# Patient Record
Sex: Female | Born: 2000 | Race: Black or African American | Hispanic: No | Marital: Single | Smoking: Never smoker
Health system: Southern US, Community
[De-identification: ages and names within clinical notes are randomized; demographics above are authoritative.]

## PROBLEM LIST (undated history)

## (undated) DIAGNOSIS — Z789 Other specified health status: Secondary | ICD-10-CM

## (undated) HISTORY — DX: Other specified health status: Z78.9

---

## 2021-09-12 ENCOUNTER — Ambulatory Visit (INDEPENDENT_AMBULATORY_CARE_PROVIDER_SITE_OTHER): Payer: Medicaid Other

## 2021-09-12 ENCOUNTER — Encounter: Payer: Self-pay | Admitting: General Practice

## 2021-09-12 ENCOUNTER — Ambulatory Visit (INDEPENDENT_AMBULATORY_CARE_PROVIDER_SITE_OTHER): Payer: Medicaid Other | Admitting: Obstetrics & Gynecology

## 2021-09-12 ENCOUNTER — Other Ambulatory Visit: Payer: Self-pay

## 2021-09-12 ENCOUNTER — Encounter: Payer: Self-pay | Admitting: Obstetrics & Gynecology

## 2021-09-12 ENCOUNTER — Other Ambulatory Visit (HOSPITAL_COMMUNITY)
Admission: RE | Admit: 2021-09-12 | Discharge: 2021-09-12 | Disposition: A | Discharge status: Home / Self Care | Payer: Medicaid Other | Source: Ambulatory Visit | Attending: Obstetrics & Gynecology | Admitting: Obstetrics & Gynecology

## 2021-09-12 VITALS — BP 126/71 | HR 124 | Ht 63.0 in | Wt 119.0 lb

## 2021-09-12 DIAGNOSIS — Z3A12 12 weeks gestation of pregnancy: Secondary | ICD-10-CM

## 2021-09-12 DIAGNOSIS — Z3A10 10 weeks gestation of pregnancy: Secondary | ICD-10-CM

## 2021-09-12 DIAGNOSIS — Z3401 Encounter for supervision of normal first pregnancy, first trimester: Secondary | ICD-10-CM

## 2021-09-12 NOTE — Patient Instructions (Signed)
First Trimester of Pregnancy °The first trimester of pregnancy starts on the first day of your last menstrual period until the end of week 12. This is months 1 through 3 of pregnancy. A week after a sperm fertilizes an egg, the egg will implant into the wall of the uterus and begin to develop into a baby. By the end of 12 weeks, all the baby's organs will be formed and the baby will be 2-3 inches in size. °Body changes during your first trimester °Your body goes through many changes during pregnancy. The changes vary and generally return to normal after your baby is born. °Physical changes °You may gain or lose weight. °Your breasts may begin to grow larger and become tender. The tissue that surrounds your nipples (areola) may become darker. °Dark spots or blotches (chloasma or mask of pregnancy) may develop on your face. °You may have changes in your hair. These can include thickening or thinning of your hair or changes in texture. °Health changes °You may feel nauseous, and you may vomit. °You may have heartburn. °You may develop headaches. °You may develop constipation. °Your gums may bleed and may be sensitive to brushing and flossing. °Other changes °You may tire easily. °You may urinate more often. °Your menstrual periods will stop. °You may have a loss of appetite. °You may develop cravings for certain kinds of food. °You may have changes in your emotions from day to day. °You may have more vivid and strange dreams. °Follow these instructions at home: °Medicines °Follow your health care provider's instructions regarding medicine use. Specific medicines may be either safe or unsafe to take during pregnancy. Do not take any medicines unless told to by your health care provider. °Take a prenatal vitamin that contains at least 600 micrograms (mcg) of folic acid. °Eating and drinking °Eat a healthy diet that includes fresh fruits and vegetables, whole grains, good sources of protein such as meat, eggs, or tofu,  and low-fat dairy products. °Avoid raw meat and unpasteurized juice, milk, and cheese. These carry germs that can harm you and your baby. °If you feel nauseous or you vomit: °Eat 4 or 5 small meals a day instead of 3 large meals. °Try eating a few soda crackers. °Drink liquids between meals instead of during meals. °You may need to take these actions to prevent or treat constipation: °Drink enough fluid to keep your urine pale yellow. °Eat foods that are high in fiber, such as beans, whole grains, and fresh fruits and vegetables. °Limit foods that are high in fat and processed sugars, such as fried or sweet foods. °Activity °Exercise only as directed by your health care provider. Most people can continue their usual exercise routine during pregnancy. Try to exercise for 30 minutes at least 5 days a week. °Stop exercising if you develop pain or cramping in the lower abdomen or lower back. °Avoid exercising if it is very hot or humid or if you are at high altitude. °Avoid heavy lifting. °If you choose to, you may have sex unless your health care provider tells you not to. °Relieving pain and discomfort °Wear a good support bra to relieve breast tenderness. °Rest with your legs elevated if you have leg cramps or low back pain. °If you develop bulging veins (varicose veins) in your legs: °Wear support hose as told by your health care provider. °Elevate your feet for 15 minutes, 3-4 times a day. °Limit salt in your diet. °Safety °Wear your seat belt at all times when driving   or riding in a car. °Talk with your health care provider if someone is verbally or physically abusive to you. °Talk with your health care provider if you are feeling sad or have thoughts of hurting yourself. °Lifestyle °Do not use hot tubs, steam rooms, or saunas. °Do not douche. Do not use tampons or scented sanitary pads. °Do not use herbal remedies, alcohol, illegal drugs, or medicines that are not approved by your health care provider. Chemicals  in these products can harm your baby. °Do not use any products that contain nicotine or tobacco, such as cigarettes, e-cigarettes, and chewing tobacco. If you need help quitting, ask your health care provider. °Avoid cat litter boxes and soil used by cats. These carry germs that can cause birth defects in the baby and possibly loss of the unborn baby (fetus) by miscarriage or stillbirth. °General instructions °During routine prenatal visits in the first trimester, your health care provider will do a physical exam, perform necessary tests, and ask you how things are going. Keep all follow-up visits. This is important. °Ask for help if you have counseling or nutritional needs during pregnancy. Your health care provider can offer advice or refer you to specialists for help with various needs. °Schedule a dentist appointment. At home, brush your teeth with a soft toothbrush. Floss gently. °Write down your questions. Take them to your prenatal visits. °Where to find more information °American Pregnancy Association: americanpregnancy.org °American College of Obstetricians and Gynecologists: acog.org/en/Womens%20Health/Pregnancy °Office on Women's Health: womenshealth.gov/pregnancy °Contact a health care provider if you have: °Dizziness. °A fever. °Mild pelvic cramps, pelvic pressure, or nagging pain in the abdominal area. °Nausea, vomiting, or diarrhea that lasts for 24 hours or longer. °A bad-smelling vaginal discharge. °Pain when you urinate. °Known exposure to a contagious illness, such as chickenpox, measles, Zika virus, HIV, or hepatitis. °Get help right away if you have: °Spotting or bleeding from your vagina. °Severe abdominal cramping or pain. °Shortness of breath or chest pain. °Any kind of trauma, such as from a fall or a car crash. °New or increased pain, swelling, or redness in an arm or leg. °Summary °The first trimester of pregnancy starts on the first day of your last menstrual period until the end of week  12 (months 1 through 3). °Eating 4 or 5 small meals a day rather than 3 large meals may help to relieve nausea and vomiting. °Do not use any products that contain nicotine or tobacco, such as cigarettes, e-cigarettes, and chewing tobacco. If you need help quitting, ask your health care provider. °Keep all follow-up visits. This is important. °This information is not intended to replace advice given to you by your health care provider. Make sure you discuss any questions you have with your health care provider. °Document Revised: 04/12/2020 Document Reviewed: 02/17/2020 °Elsevier Patient Education © 2022 Elsevier Inc. ° °

## 2021-09-12 NOTE — Progress Notes (Signed)
History:   Tonya Wilkinson is a 20 y.o. G1P0 at [redacted]w[redacted]d by early ultrasound  not consistent with LMP being seen today for her first obstetrical visit.  Patient reports no complaints.      HISTORY: OB History  Gravida Para Term Preterm AB Living  1 0 0 0 0 0  SAB IAB Ectopic Multiple Live Births  0 0 0 0 0    # Outcome Date GA Lbr Len/2nd Weight Sex Delivery Anes PTL Lv  1 Current            Past Medical History:  Diagnosis Date   Medical history non-contributory    History reviewed. No pertinent surgical history. Family History  Problem Relation Age of Onset   Hypertension Sister    Cancer Neg Hx    Diabetes Neg Hx    Social History   Tobacco Use   Smoking status: Never   Smokeless tobacco: Never  Vaping Use   Vaping Use: Never used  Substance Use Topics   Alcohol use: Never   Drug use: Never   Not on File Current Outpatient Medications on File Prior to Visit  Medication Sig Dispense Refill   Prenatal Vit-Fe Fumarate-FA (MULTIVITAMIN-PRENATAL) 27-0.8 MG TABS tablet Take 1 tablet by mouth daily at 12 noon.     No current facility-administered medications on file prior to visit.    Review of Systems Pertinent items noted in HPI and remainder of comprehensive ROS otherwise negative.  Physical Exam:   Vitals:   09/12/21 1345 09/12/21 1346  BP: 126/71   Pulse: (!) 124   Weight: 119 lb (54 kg)   Height:  5\' 3"  (1.6 m)   Fetal Heart Rate (bpm): 164  Uterine size:   General: well-developed, well-nourished female in no acute distress  Breasts:  normal appearance, no masses or tenderness bilaterally, exam done in the presence of a chaperone.   Skin: normal coloration and turgor, no rashes  Neurologic: oriented, normal, negative, normal mood  Extremities: normal strength, tone, and muscle mass, ROM of all joints is normal  HEENT PERRLA, extraocular movement intact and sclera clear, anicteric  Neck supple and no masses  Cardiovascular: regular rate and rhythm   Respiratory:  no respiratory distress, normal breath sounds  Abdomen: soft, non-tender; bowel sounds normal; no masses,  no organomegaly  Pelvic: deferred    Assessment:    Pregnancy: G1P0 Patient Active Problem List   Diagnosis Date Noted   Encounter for supervision of normal first pregnancy in first trimester 09/12/2021     Plan:    1. [redacted] weeks gestation of pregnancy 2. Encounter for supervision of normal first pregnancy in first trimester - 09/14/2021 OB Limited; Future - GC/Chlamydia probe amp ([place])not at [facility] - Urine Culture - CBC/D/Plt+RPR+Rh+ABO+RubIgG... - [facility] MFM OB COMP + 14 WK; Future - Genetic Screening - Enroll Patient in PreNatal Babyscripts  Initial labs drawn. Continue prenatal vitamins. Problem list reviewed and updated. Genetic Screening discussed, NIPS: ordered. Ultrasound discussed; fetal anatomic survey: ordered. Anticipatory guidance about prenatal visits given including labs, ultrasounds, and testing. Discussed usage of Babyscripts.  Encouraged to complete MyChart Registration for her ability to review results, send requests, and have questions addressed.  The nature of [place] [facility] with multiple MDs and Advanced Practice Providers was explained to patient; also emphasized that residents, students are part of our team. Routine obstetric precautions reviewed. Encouraged to seek out care at office or emergency room [facility] preferred)  for urgent and/or emergent concerns. Return in about 4 weeks (around 10/10/2021) for OFFICE OB VISIT (MD or APP).     Jaynie Collins, MD, FACOG Obstetrician & Gynecologist, [facility] for [employer], [facility]

## 2021-09-13 LAB — CBC/D/PLT+RPR+RH+ABO+RUBIGG...
Antibody Screen: NEGATIVE
Basophils Absolute: 0 10*3/uL (ref 0.0–0.2)
Basos: 1 %
EOS (ABSOLUTE): 0 10*3/uL (ref 0.0–0.4)
Eos: 0 %
HCV Ab: <0.1 s/co ratio (ref 0.0–0.9)
HIV Screen 4th Generation wRfx: NONREACTIVE
Hematocrit: 40.5 % (ref 34.0–46.6)
Hemoglobin: 13.6 g/dL (ref 11.1–15.9)
Hepatitis B Surface Ag: NEGATIVE
Immature Grans (Abs): 0 10*3/uL (ref 0.0–0.1)
Immature Granulocytes: 0 %
Lymphocytes Absolute: 1.3 10*3/uL (ref 0.7–3.1)
Lymphs: 25 %
MCH: 28.3 pg (ref 26.6–33.0)
MCHC: 33.6 g/dL (ref 31.5–35.7)
MCV: 84 fL (ref 79–97)
Monocytes Absolute: 0.5 10*3/uL (ref 0.1–0.9)
Monocytes: 10 %
Neutrophils Absolute: 3.2 10*3/uL (ref 1.4–7.0)
Neutrophils: 64 %
Platelets: 253 10*3/uL (ref 150–450)
RBC: 4.81 x10E6/uL (ref 3.77–5.28)
RDW: 13.2 % (ref 11.7–15.4)
RPR Ser Ql: NONREACTIVE
Rh Factor: POSITIVE
Rubella Antibodies, IGG: 6.98 index (ref >0.99)
WBC: 5.1 10*3/uL (ref 3.4–10.8)

## 2021-09-13 LAB — HCV INTERPRETATION

## 2021-09-14 LAB — GC/CHLAMYDIA PROBE AMP (~~LOC~~) NOT AT ARMC
Chlamydia: NEGATIVE
Comment: NEGATIVE
Comment: NORMAL
Neisseria Gonorrhea: NEGATIVE

## 2021-09-19 LAB — URINE CULTURE

## 2021-09-25 ENCOUNTER — Encounter: Payer: Self-pay | Admitting: Obstetrics & Gynecology

## 2021-09-25 ENCOUNTER — Other Ambulatory Visit: Payer: Self-pay

## 2021-09-25 DIAGNOSIS — O289 Unspecified abnormal findings on antenatal screening of mother: Secondary | ICD-10-CM

## 2021-09-25 NOTE — Progress Notes (Signed)
Order placed for genetic counsel. Armandina Stammer RN

## 2021-09-25 NOTE — Progress Notes (Signed)
Panorama is resulted as high risk for possible triploidy.  Needs referral to MFM/genetic counseling for first trimester anatomy scan,  possible amniocentesis, further evaluation and counseling  Jaynie Collins, MD

## 2021-09-26 ENCOUNTER — Telehealth: Payer: Self-pay

## 2021-09-26 ENCOUNTER — Other Ambulatory Visit: Payer: Self-pay

## 2021-09-26 DIAGNOSIS — O289 Unspecified abnormal findings on antenatal screening of mother: Secondary | ICD-10-CM

## 2021-09-26 NOTE — Telephone Encounter (Signed)
Patient called back and verified name and dob. Patient made aware of increased risk of triploidy. Patient made aware that we have place referral for her for a genetic counselor consult and they will answer her questions about this result with more detail. Patient states understanding. Armandina Stammer RN

## 2021-09-26 NOTE — Telephone Encounter (Signed)
Attempted to reach patient about her Panorama test. Mailbox is full and unable to leave message.   Will send my chart message requesting that she call the office. Armandina Stammer RN

## 2021-10-04 ENCOUNTER — Ambulatory Visit: Payer: Medicaid Other | Admitting: *Deleted

## 2021-10-04 ENCOUNTER — Encounter: Payer: Self-pay | Admitting: *Deleted

## 2021-10-04 ENCOUNTER — Ambulatory Visit: Payer: Medicaid Other | Attending: Obstetrics | Admitting: Obstetrics

## 2021-10-04 ENCOUNTER — Ambulatory Visit: Payer: Medicaid Other | Attending: Obstetrics & Gynecology

## 2021-10-04 ENCOUNTER — Other Ambulatory Visit: Payer: Self-pay

## 2021-10-04 ENCOUNTER — Ambulatory Visit (HOSPITAL_BASED_OUTPATIENT_CLINIC_OR_DEPARTMENT_OTHER): Payer: Medicaid Other

## 2021-10-04 VITALS — BP 142/77 | HR 105 | Wt 121.8 lb

## 2021-10-04 DIAGNOSIS — O289 Unspecified abnormal findings on antenatal screening of mother: Secondary | ICD-10-CM

## 2021-10-04 DIAGNOSIS — O3110X0 Continuing pregnancy after spontaneous abortion of one fetus or more, unspecified trimester, not applicable or unspecified: Secondary | ICD-10-CM | POA: Diagnosis not present

## 2021-10-04 DIAGNOSIS — O280 Abnormal hematological finding on antenatal screening of mother: Secondary | ICD-10-CM | POA: Diagnosis not present

## 2021-10-04 DIAGNOSIS — Z3A13 13 weeks gestation of pregnancy: Secondary | ICD-10-CM

## 2021-10-04 DIAGNOSIS — Z363 Encounter for antenatal screening for malformations: Secondary | ICD-10-CM | POA: Diagnosis not present

## 2021-10-04 DIAGNOSIS — Z3401 Encounter for supervision of normal first pregnancy, first trimester: Secondary | ICD-10-CM

## 2021-10-04 NOTE — Progress Notes (Signed)
MFM Note  Tonya Wilkinson was seen for a first trimester ultrasound and consultation as her cell free DNA test indicated a high risk either due to a vanishing twin, unrecognized multiple gestation, or there may be an increased risk for fetal triploidy.  The test could not provide any results regarding trisomy 21, 18, or 13.  The fetal gender could not be revealed.  The patient denies any other problems in her current pregnancy.    She denies any significant past medical history.  On today's exam, a viable singleton gestation was noted.  The crown-rump length measures consistent with an EDC of Apr 08, 2022.  There was normal amniotic fluid noted.  There appears to be an empty sac next to the viable singleton gestation, indicating that a vanishing twin was the most likely the cause of her abnormal cell free DNA test.  A thick dividing membrane was noted separating the two sacs, indicating that this pregnancy most likely started off as a dichorionic, diamniotic twin gestation.  As these were most likely dichorionic twins, the demise of one twin will not have any effects on the other.  The patient was advised regarding the availability of a CVS in the first trimester and amniocentesis in the second trimester for definitive diagnosis of triploidy or other fetal chromosomal abnormalities.  She declined to pursue any further testing.  A detailed fetal anatomy scan has been scheduled for her at 19 weeks.  She was advised that the empty gestational sac noted today will most likely be unable to be visualized during her future exams.    The patient stated that all of her questions have been answered.  A total of 30 minutes was spent counseling and coordinating the care for this patient.  Greater than 50% of the time was spent in direct face-to-face contact.

## 2021-10-04 NOTE — Progress Notes (Signed)
  Name: Ocie Stanzione Indication: Discuss Abnormal NIPS Result  DOB: 2000/12/01 Age: 20 y.o.   EDC: 04/07/2022 LMP: 06/18/2021 Referring Provider:  Jaynie Collins, MD  EGA: [redacted]w[redacted]d Genetic Counselor: Teena Dunk, MS, CGC  OB Hx: G1P0 Date of Appointment: 10/04/2021  Accompanied by: Her twin sister Face to Face Time: 30 Minutes   Previous Testing Completed: Kalissa previously completed carrier screening (scanned into Epic under the Media tab). She screened to not be a carrier for Cystic Fibrosis (CF), Spinal Muscular Atrophy (SMA), alpha thalassemia, and beta hemoglobinopathies. A negative result on carrier screening reduces the likelihood of being a carrier, however, does not entirely rule out the possibility.   Medical History:  Reports she takes prenatal vitamins. Denies personal history of diabetes, high blood pressure, thyroid conditions, and seizures. Denies bleeding, infections, and fevers in this pregnancy. Denies using tobacco, alcohol, or street drugs in this pregnancy.     Genetic Counseling:   Abnormal Non-Invasive Prenatal Screening (NIPS) Result. Estrellita completed NIPS in this pregnancy. The result is high risk and suggests that there is either a vanishing twin, an unrecognized multiple gestation, or an increased risk of fetal triploidy. Genetic counseling reviewed with [place] the results of today's ultrasound: "a viable singleton gestation was noted. There appears to be an empty sac next to the viable singleton gestation... a thick dividing membrane was noted separating the two sacs, indicating that this pregnancy most likely started off as a dichorionic, diamniotic twin gestation." We reviewed the technology/methods of NIPS screening and discussed that the vanishing twin is most likely the reason the NIPS result is abnormal. Given that no results could be obtained from NIPS for the current pregnancy's risk for Down syndrome, Trisomy 78, Trisomy 72, and common sex chromosome  conditions, genetic counseling offered Giuseppina the option of CVS/amniocentesis for prenatal diagnosis. Kendrah declined CVS/amniocentesis at this time. Sarabeth appeared to understand that we cannot guarantee that the current pregnancy is genetically healthy.  Birth Defects. All babies have approximately a 3-5% risk for a birth defect and a majority of these defects cannot be detected through the screening or diagnostic testing listed below. Ultrasound may detect some birth defects, but it may not detect all birth defects. About half of pregnancies with Down syndrome do not show any soft markers on ultrasound. A normal ultrasound does not guarantee a healthy pregnancy.   Testing/Screening Options:   CVS/Amniocentesis. These procedures are available for prenatal diagnosis. Possible procedural difficulties and complications that can arise with these procedures include maternal infection, cramping, bleeding, fluid leakage, and/or pregnancy loss. The risk for pregnancy loss with a CVS is 1/300-500. The risk for pregnancy loss with an amniocentesis is 1/500-1,000. Per the [employer] of Obstetricians and Gynecologists (ACOG) Practice Bulletin 162, all pregnant women should be offered prenatal assessment for aneuploidy by diagnostic testing regardless of maternal age or other risk factors. If indicated, genetic testing that could be ordered on a CVS or amniocentesis sample includes a fetal karyotype, fetal microarray, and testing for specific syndromes.    Patient Plan:  Proceed with: Routine prenatal care Informed consent was obtained. All questions were answered.  Declined: CVS, Amniocentesis   Thank you for sharing in the care of Lidia with [place].  Please do not hesitate to contact us if you have any questions.  Teena Dunk, MS, [facility]

## 2021-10-09 ENCOUNTER — Ambulatory Visit (INDEPENDENT_AMBULATORY_CARE_PROVIDER_SITE_OTHER): Payer: Medicaid Other

## 2021-10-09 ENCOUNTER — Other Ambulatory Visit: Payer: Self-pay

## 2021-10-09 VITALS — BP 117/75 | HR 104 | Wt 129.0 lb

## 2021-10-09 DIAGNOSIS — Z3401 Encounter for supervision of normal first pregnancy, first trimester: Secondary | ICD-10-CM

## 2021-10-09 DIAGNOSIS — Z3A14 14 weeks gestation of pregnancy: Secondary | ICD-10-CM

## 2021-10-09 NOTE — Progress Notes (Signed)
   LOW-RISK PREGNANCY OFFICE VISIT  Patient name: Tonya Wilkinson MRN [id number]  Date of birth: 26-Jul-2001 Chief Complaint:   Routine Prenatal Visit  Subjective:   Tonya Wilkinson is a 20 y.o. G1P0 female at [redacted]w[redacted]d with an Estimated Date of Delivery: 04/07/22 being seen today for ongoing management of a low-risk pregnancy aeb has Encounter for supervision of normal first pregnancy in first trimester and Abnormal finding on antenatal screen on their problem list.  Patient presents today with, SO Isaiah, and has no complaints.  Patient endorses fetal movement. Patient denies abdominal cramping or contractions.  Patient denies vaginal concerns including abnormal discharge, leaking of fluid, and bleeding.  Contractions: Not present. Vag. Bleeding: None.  Movement: Absent.  Reviewed past medical,surgical, social, obstetrical and family history as well as problem list, medications and allergies.  Objective   Vitals:   10/09/21 1055  BP: 117/75  Pulse: (!) 104  Weight: 129 lb (58.5 kg)  Body mass index is 22.85 kg/m.  Total Weight Gain:14 lb (6.35 kg)         Physical Examination:   General appearance: Well appearing, and in no distress  Mental status: Alert, oriented to person, place, and time  Skin: Warm & dry  Cardiovascular: Normal heart rate noted  Respiratory: Normal respiratory effort, no distress  Abdomen: Soft, gravid, nontender, AGA with fundus U/-5  Pelvic: Cervical exam deferred           Extremities: Edema: None  Fetal Status: Fetal Heart Rate (bpm): 154  Movement: Absent   No results found for this or any previous visit (from the past 24 hour(s)).  Assessment & Plan:  Low-risk pregnancy of a 20 y.o., G1P0 at [redacted]w[redacted]d with an Estimated Date of Delivery: 04/07/22   1. Encounter for supervision of normal first pregnancy in first trimester -Anticipatory guidance for upcoming appts. -Patient to schedule next appt in 4 weeks for an in-person visit. -Reviewed US  findings. -Patient understands she could have been a twin mom. -Plan to complete AFP at next visit. Reviewed.    2. [redacted] weeks gestation of pregnancy -Doing well. -Reports anxiety about going to doctors appts. -Reassured that most appts will be about patient and addressing q/c she may have.      Meds: No orders of the defined types were placed in this encounter.  Labs/procedures today:  Lab Orders  No laboratory test(s) ordered today     Reviewed: Preterm labor symptoms and general obstetric precautions including but not limited to vaginal bleeding, contractions, leaking of fluid and fetal movement were reviewed in detail with the patient.  All questions were answered.  Follow-up: No follow-ups on file.  No orders of the defined types were placed in this encounter.  Cherre Robins MSN, CNM 10/09/2021

## 2021-11-06 ENCOUNTER — Ambulatory Visit (INDEPENDENT_AMBULATORY_CARE_PROVIDER_SITE_OTHER): Payer: Medicaid Other | Admitting: Advanced Practice Midwife

## 2021-11-06 ENCOUNTER — Telehealth: Payer: Self-pay

## 2021-11-06 ENCOUNTER — Other Ambulatory Visit: Payer: Self-pay | Admitting: Obstetrics & Gynecology

## 2021-11-06 ENCOUNTER — Other Ambulatory Visit: Payer: Self-pay

## 2021-11-06 VITALS — BP 120/83 | HR 100 | Wt 123.0 lb

## 2021-11-06 DIAGNOSIS — Z3401 Encounter for supervision of normal first pregnancy, first trimester: Secondary | ICD-10-CM

## 2021-11-06 DIAGNOSIS — O289 Unspecified abnormal findings on antenatal screening of mother: Secondary | ICD-10-CM

## 2021-11-06 NOTE — Progress Notes (Signed)
° °  PRENATAL VISIT NOTE  Subjective:  Tonya Wilkinson is a 20 y.o. G1P0 at [redacted]w[redacted]d being seen today for ongoing prenatal care.  She is currently monitored for the following issues for this low-risk pregnancy and has Encounter for supervision of normal first pregnancy in first trimester and Abnormal finding on antenatal screen on their problem list.  Patient reports no complaints.  Contractions: Not present. Vag. Bleeding: None.  Movement: Present. Denies leaking of fluid.   The following portions of the patient's history were reviewed and updated as appropriate: allergies, current medications, past family history, past medical history, past social history, past surgical history and problem list.   Objective:   Vitals:   11/06/21 1031  BP: 120/83  Pulse: 100  Weight: 123 lb (55.8 kg)    Fetal Status: Fetal Heart Rate (bpm): 152   Movement: Present     General:  Alert, oriented and cooperative. Patient is in no acute distress.  Skin: Skin is warm and dry. No rash noted.   Cardiovascular: Normal heart rate noted  Respiratory: Normal respiratory effort, no problems with respiration noted  Abdomen: Soft, gravid, appropriate for gestational age.  Pain/Pressure: Absent     Pelvic: Cervical exam deferred        Extremities: Normal range of motion.  Edema: None  Mental Status: Normal mood and affect. Normal behavior. Normal judgment and thought content.   Assessment and Plan:  Pregnancy: G1P0 at [redacted]w[redacted]d 1. Encounter for supervision of normal first pregnancy in first trimester       2. Abnormal finding on antenatal screen      MFM feels this is due to vanishing twin seen on [place].       Pt went to have a 3D [place] and states baby is a boy "[name]"  Preterm labor symptoms and general obstetric precautions including but not limited to vaginal bleeding, contractions, leaking of fluid and fetal movement were reviewed in detail with the patient. Please refer to After Visit Summary for other counseling  recommendations.   Return in about 4 weeks (around 12/04/2021) for [employer].  Future Appointments  Date Time Provider Department Center  11/13/2021  1:00 PM [facility] US[facility]    Wynelle Bourgeois, CNM

## 2021-11-06 NOTE — Telephone Encounter (Signed)
MAR/SW PATIENT AND ADVISED NURSE VISIT ADDED, APPT TIME 12/27 IS 1245P.

## 2021-11-13 ENCOUNTER — Other Ambulatory Visit: Payer: Self-pay

## 2021-11-13 ENCOUNTER — Encounter: Payer: Self-pay | Admitting: *Deleted

## 2021-11-13 ENCOUNTER — Ambulatory Visit: Payer: Medicaid Other | Attending: Obstetrics & Gynecology

## 2021-11-13 ENCOUNTER — Ambulatory Visit: Payer: Medicaid Other | Admitting: *Deleted

## 2021-11-13 VITALS — BP 120/63 | HR 96

## 2021-11-13 DIAGNOSIS — Z3A19 19 weeks gestation of pregnancy: Secondary | ICD-10-CM | POA: Insufficient documentation

## 2021-11-13 DIAGNOSIS — O289 Unspecified abnormal findings on antenatal screening of mother: Secondary | ICD-10-CM | POA: Diagnosis present

## 2021-11-13 DIAGNOSIS — Z3689 Encounter for other specified antenatal screening: Secondary | ICD-10-CM | POA: Diagnosis not present

## 2021-11-13 DIAGNOSIS — Z3401 Encounter for supervision of normal first pregnancy, first trimester: Secondary | ICD-10-CM | POA: Diagnosis present

## 2021-11-13 DIAGNOSIS — Z363 Encounter for antenatal screening for malformations: Secondary | ICD-10-CM | POA: Insufficient documentation

## 2021-11-18 NOTE — L&D Delivery Note (Signed)
OB/GYN Faculty Practice Delivery Note  Tonya Wilkinson is a 21 y.o. G1P1001 s/p SVD at [redacted]w[redacted]d. She was admitted for SOL and gHTN.   ROM: 0h 87m with clear fluid GBS Status: Negative   Delivery Date/Time: 04/11/22 at 1537  Delivery: Called to room and patient was complete and pushing. Head delivered LOA with compound hand. No nuchal cord present. Shoulders and body delivered in usual fashion. Infant with spontaneous cry, placed on mother's abdomen, dried and stimulated. Cord clamped x 2 after 1-minute delay and cut by FOB under direct supervision. Cord blood drawn. Placenta delivered spontaneously with gentle cord traction. Fundus firm with massage and Pitocin. Labia, perineum, vagina, and cervix were inspected, and patient was found to have bilateral vaginal side wall lacerations that were repaired with 3-0 Vicryl and found to be hemostatic.   Placenta: Intact, 3VC - sent to L&D Complications: None  Lacerations: Bilateral vaginal side wall lacerations  EBL: 250 cc Analgesia: Epidural   Infant: Viable female  APGARs 9 and 9  4010 g  Post-Placental IUD Insertion Procedure Note (Mirena)  Patient identified, informed consent signed prior to delivery, signed copy in chart, time out was performed.    - IUD grasped between sterile gloved fingers. Fundus identified through abdominal wall using non-insertion hand. IUD inserted to fundus with bimanual technique. IUD carefully released at the fundus and insertion hand gently removed from vagina.    Strings trimmed to the level of the introitus. Patient tolerated procedure well.  Lot # [id number] Expiration Date: 03/18/2024  Patient given post procedure instructions and IUD care card with expiration date.  Patient asked to keep IUD strings tucked in her vagina until her postpartum follow up visit in 4-6 weeks. Patient advised to abstain from sexual intercourse and pulling on strings before her follow-up visit. Patient verbalized an understanding of the  plan of care and agrees.   Vilma Meckel, MD OB/GYN Fellow, Faculty Practice

## 2021-12-25 ENCOUNTER — Other Ambulatory Visit: Payer: Self-pay

## 2021-12-25 ENCOUNTER — Ambulatory Visit (INDEPENDENT_AMBULATORY_CARE_PROVIDER_SITE_OTHER): Payer: Medicaid Other | Admitting: Advanced Practice Midwife

## 2021-12-25 ENCOUNTER — Encounter: Payer: Self-pay | Admitting: Advanced Practice Midwife

## 2021-12-25 VITALS — BP 115/72 | HR 78 | Wt 130.0 lb

## 2021-12-25 DIAGNOSIS — Z3A25 25 weeks gestation of pregnancy: Secondary | ICD-10-CM

## 2021-12-25 NOTE — Progress Notes (Signed)
° °  PRENATAL VISIT NOTE  Subjective:  Tonya Wilkinson is a 21 y.o. G1P0 at [redacted]w[redacted]d being seen today for ongoing prenatal care.  She is currently monitored for the following issues for this low-risk pregnancy and has Encounter for supervision of normal first pregnancy in first trimester and Abnormal finding on antenatal screen on their problem list.  Patient reports no complaints.  Contractions: Not present. Vag. Bleeding: None.  Movement: Present. Denies leaking of fluid.   The following portions of the patient's history were reviewed and updated as appropriate: allergies, current medications, past family history, past medical history, past social history, past surgical history and problem list.   Objective:   Vitals:   12/25/21 1114  BP: 115/72  Pulse: 78  Weight: 130 lb (59 kg)    Fetal Status: Fetal Heart Rate (bpm): 144   Movement: Present     General:  Alert, oriented and cooperative. Patient is in no acute distress.  Skin: Skin is warm and dry. No rash noted.   Cardiovascular: Normal heart rate noted  Respiratory: Normal respiratory effort, no problems with respiration noted  Abdomen: Soft, gravid, appropriate for gestational age.  Pain/Pressure: Absent     Pelvic: Cervical exam deferred        Extremities: Normal range of motion.  Edema: None  Mental Status: Normal mood and affect. Normal behavior. Normal judgment and thought content.   Assessment and Plan:  Pregnancy: G1P0 at [redacted]w[redacted]d 1. [redacted] weeks gestation of pregnancy     Plan glucola in 1-2 weeks  Preterm labor symptoms and general obstetric precautions including but not limited to vaginal bleeding, contractions, leaking of fluid and fetal movement were reviewed in detail with the patient. Please refer to After Visit Summary for other counseling recommendations.   Return in about 4 weeks (around 01/22/2022) for [facility].  Future Appointments  Date Time Provider [place]  01/15/2022  8:15 AM Gavin Pound, CNM [facility] None  01/29/2022  9:35 AM Gavin Pound, CNM [facility] None  02/12/2022 10:35 AM Seabron Spates, CNM [facility] None  02/26/2022 10:35 AM Seabron Spates, CNM [facility] None  03/12/2022 10:35 AM Seabron Spates, CNM [facility] None    Hansel Feinstein, CNM

## 2022-01-15 ENCOUNTER — Other Ambulatory Visit: Payer: Self-pay

## 2022-01-15 ENCOUNTER — Encounter: Payer: Self-pay | Admitting: General Practice

## 2022-01-15 ENCOUNTER — Ambulatory Visit (INDEPENDENT_AMBULATORY_CARE_PROVIDER_SITE_OTHER): Payer: Medicaid Other

## 2022-01-15 VITALS — BP 122/72 | HR 104 | Wt 126.0 lb

## 2022-01-15 DIAGNOSIS — Z3A28 28 weeks gestation of pregnancy: Secondary | ICD-10-CM

## 2022-01-15 DIAGNOSIS — Z3401 Encounter for supervision of normal first pregnancy, first trimester: Secondary | ICD-10-CM

## 2022-01-15 NOTE — Progress Notes (Signed)
° °  LOW-RISK PREGNANCY OFFICE VISIT  Patient name: Tonya Wilkinson MRN [id number]  Date of birth: 2001-10-05 Chief Complaint:   No chief complaint on file.  Subjective:   Tonya Wilkinson is a 21 y.o. G1P0 female at [redacted]w[redacted]d with an Estimated Date of Delivery: 04/07/22 being seen today for ongoing management of a low-risk pregnancy aeb has Encounter for supervision of normal first pregnancy in first trimester and Abnormal finding on antenatal screen on their problem list.  Patient presents today with, FOB Isiah, and has no complaints.  Patient endorses fetal movement. Patient denies back pain, abdominal cramping, or contractions.  Patient denies vaginal concerns including abnormal discharge, leaking of fluid, and bleeding.  No issues with urination or bowel movements.    Contractions: Not present. Vag. Bleeding: None.  Movement: Present.  Reviewed past medical,surgical, social, obstetrical and family history as well as problem list, medications and allergies.  Objective   Vitals:   01/15/22 0807  BP: 122/72  Pulse: (!) 104  Weight: 126 lb (57.2 kg)  Body mass index is 22.32 kg/m.  Total Weight Gain:11 lb (4.99 kg)         Physical Examination:   General appearance: Well appearing, and in no distress  Mental status: Alert, oriented to person, place, and time  Skin: Warm & dry  Cardiovascular: Normal heart rate noted  Respiratory: Normal respiratory effort, no distress  Abdomen: Soft, gravid, nontender, AGA with Fundal Height: 28 cm  Pelvic: Cervical exam deferred           Extremities: Edema: None  Fetal Status: Fetal Heart Rate (bpm): 146  Movement: Present   No results found for this or any previous visit (from the past 24 hour(s)).  Assessment & Plan:  Low-risk pregnancy of a 21 y.o., G1P0 at [redacted]w[redacted]d with an Estimated Date of Delivery: 04/07/22   1. [redacted] weeks gestation of pregnancy -Doing well. No complaints -Reports anxiety about going to doctors appt has improved. -Discussed  attending CB class to help prepare for labor and delivery. -Instructed to visit [email or web address] for further information and enrollment into courses.  2. Encounter for supervision of normal first pregnancy in first trimester -Anticipatory guidance for upcoming appts. -Patient to schedule next appt in 2-3 weeks for an in-person visit. -Completed Glucola today -Reviewed blood draw procedures and labs which also include check of iron/HgB level, RPR, and HIV *Informed that repeat RPR/HIV are for pediatric records/compliance.  -Discussed how results of GTT are handled including diabetic education and BS testing for abnormal results and routine care for normal results.     Meds: No orders of the defined types were placed in this encounter.  Labs/procedures today: Lab Orders         CBC         Glucose Tolerance, 2 Hours w/1 Hour         HIV Antibody (routine testing w rflx)         RPR      Reviewed: Preterm labor symptoms and general obstetric precautions including but not limited to vaginal bleeding, contractions, leaking of fluid and fetal movement were reviewed in detail with the patient.  All questions were answered.  Follow-up: Return in about 2 weeks (around 01/29/2022) for [facility].  Orders Placed This Encounter  Procedures   CBC   Glucose Tolerance, 2 Hours w/1 Hour   HIV Antibody (routine testing w rflx)   RPR   Maryann Conners MSN, CNM 01/15/2022

## 2022-01-16 LAB — GLUCOSE TOLERANCE, 2 HOURS W/ 1HR
Glucose, 1 hour: 122 mg/dL (ref 70–179)
Glucose, 2 hour: 94 mg/dL (ref 70–152)
Glucose, Fasting: 73 mg/dL (ref 70–91)

## 2022-01-16 LAB — CBC
Hematocrit: 36 % (ref 34.0–46.6)
Hemoglobin: 12.3 g/dL (ref 11.1–15.9)
MCH: 29.1 pg (ref 26.6–33.0)
MCHC: 34.2 g/dL (ref 31.5–35.7)
MCV: 85 fL (ref 79–97)
Platelets: 263 10*3/uL (ref 150–450)
RBC: 4.23 x10E6/uL (ref 3.77–5.28)
RDW: 13.3 % (ref 11.7–15.4)
WBC: 5.8 10*3/uL (ref 3.4–10.8)

## 2022-01-16 LAB — HIV ANTIBODY (ROUTINE TESTING W REFLEX): HIV Screen 4th Generation wRfx: NONREACTIVE

## 2022-01-16 LAB — RPR: RPR Ser Ql: NONREACTIVE

## 2022-01-29 ENCOUNTER — Ambulatory Visit (INDEPENDENT_AMBULATORY_CARE_PROVIDER_SITE_OTHER): Payer: Medicaid Other

## 2022-01-29 ENCOUNTER — Other Ambulatory Visit: Payer: Self-pay

## 2022-01-29 VITALS — BP 128/73 | HR 81 | Wt 128.0 lb

## 2022-01-29 DIAGNOSIS — O289 Unspecified abnormal findings on antenatal screening of mother: Secondary | ICD-10-CM

## 2022-01-29 DIAGNOSIS — Z3401 Encounter for supervision of normal first pregnancy, first trimester: Secondary | ICD-10-CM

## 2022-01-29 DIAGNOSIS — Z3A30 30 weeks gestation of pregnancy: Secondary | ICD-10-CM

## 2022-01-29 NOTE — Progress Notes (Signed)
? ?  LOW-RISK PREGNANCY OFFICE VISIT ? ?Patient name: Tonya Wilkinson MRN [id number]  Date of birth: 27-Jun-2001 ?Chief Complaint:   ?No chief complaint on file. ? ?Subjective:   ?Tonya Wilkinson is a 21 y.o. G1P0 female at [redacted]w[redacted]d with an Estimated Date of Delivery: 04/07/22 being seen today for ongoing management of a low-risk pregnancy aeb has Encounter for supervision of normal first pregnancy in first trimester and Abnormal finding on antenatal screen on their problem list. ? ?Patient presents today with FOB Duwayne Heck and has no complaints, despite looking fatigued.  Patient reports she stayed up late last night watching [employer] videos!   Patient endorses fetal movement. Patient denies abdominal cramping or contractions.  Patient denies vaginal concerns including abnormal discharge, leaking of fluid, and bleeding.  ? ?She denies issues with urination, constipation, or diarrhea.  ? ? Contractions: Not present. Vag. Bleeding: None.  Movement: Present. ? ?Reviewed past medical,surgical, social, obstetrical and family history as well as problem list, medications and allergies. ? ?Objective  ? ?Vitals:  ? 01/29/22 0951  ?BP: 128/73  ?Pulse: 81  ?Weight: 128 lb (58.1 kg)  ?Body mass index is 22.67 kg/m?.  ?Total Weight Gain:13 lb (5.897 kg) ? ?  ?     Physical Examination:  ? General appearance: Well appearing, and in no distress ? Mental status: Alert, oriented to person, place, and time ? Skin: Warm & dry ? Cardiovascular: Normal heart rate noted ? Respiratory: Normal respiratory effort, no distress ? Abdomen: Soft, gravid, nontender, AGA with   ? Pelvic: Cervical exam deferred          ? Extremities: Edema: None ? ?Fetal Status: Fetal Heart Rate (bpm): 142  Movement: Present  ? ?No results found for this or any previous visit (from the past 24 hour(s)).  ?Assessment & Plan:  ?Low-risk pregnancy of a 21 y.o., G1P0 at [redacted]w[redacted]d with an Estimated Date of Delivery: 04/07/22  ? ?1. Encounter for supervision of normal first pregnancy in  first trimester ?-Anticipatory guidance for upcoming appts. ?-Patient to schedule next appt in 2 weeks for a virtual visit. ?-Patient late today >15 minutes.  ?-Reviewed availability of virtual visits for convenience, but completion of PNV. ?-Patient agreeable. ?-Given BP cuff and information in AVS on how to take properly. ?-Reviewed virtual visit preparation and platform.  ? ?2. [redacted] weeks gestation of pregnancy ?-Doing well ?-Rest encouraged. ?-Has not scheduled for CB class.  Provider still strongly recommends.  ? ?3. Abnormal finding on antenatal screen ?-No additional [place] scheduled. ?-Will review with MFM and/or MD to assess if further need necessary. ?  ?Meds: No orders of the defined types were placed in this encounter. ? ?Labs/procedures today:  ?Lab Orders  ?No laboratory test(s) ordered today  ?  ? ?Reviewed: Preterm labor symptoms and general obstetric precautions including but not limited to vaginal bleeding, contractions, leaking of fluid and fetal movement were reviewed in detail with the patient.  All questions were answered. ? ?Follow-up: Return in about 2 weeks (around 02/12/2022) for Virtual LR-ROB. ? ?No orders of the defined types were placed in this encounter. ? ?Cherre Robins MSN, CNM ?01/29/2022 ? ?

## 2022-02-12 ENCOUNTER — Telehealth (INDEPENDENT_AMBULATORY_CARE_PROVIDER_SITE_OTHER): Payer: Medicaid Other | Admitting: Advanced Practice Midwife

## 2022-02-12 VITALS — BP 123/75 | HR 82

## 2022-02-12 DIAGNOSIS — Z3A32 32 weeks gestation of pregnancy: Secondary | ICD-10-CM

## 2022-02-12 DIAGNOSIS — O289 Unspecified abnormal findings on antenatal screening of mother: Secondary | ICD-10-CM

## 2022-02-12 DIAGNOSIS — Z3401 Encounter for supervision of normal first pregnancy, first trimester: Secondary | ICD-10-CM

## 2022-02-12 NOTE — Progress Notes (Signed)
? ? ?  GYNECOLOGY VIRTUAL VISIT ENCOUNTER NOTE ? ?Provider location: [facility] for [employer] at [facility]  ? ?Patient location: Home ? ?I connected with Marko Plume on 02/12/22 at 10:35 AM EDT by MyChart Video Encounter and verified that I am speaking with the correct person using two identifiers. ?  ?I discussed the limitations, risks, security and privacy concerns of performing an evaluation and management service virtually and the availability of in person appointments. I also discussed with the patient that there may be a patient responsible charge related to this service. The patient expressed understanding and agreed to proceed. ?  ?History:  ?Tonya Wilkinson is a 21 y.o. G1P0 female being evaluated today for routine OB visit. [redacted]w[redacted]d ?She denies any abnormal vaginal discharge, bleeding, pelvic pain or other concerns.   ?  ?  ?Past Medical History:  ?Diagnosis Date  ? Medical history non-contributory   ? ? ?Review of Systems:  ?Pertinent items noted in HPI and remainder of comprehensive ROS otherwise negative. ? ?Physical Exam:  ? ?General:  Alert, oriented and cooperative. Patient appears to be in no acute distress.  ?Mental Status: Normal mood and affect. Normal behavior. Normal judgment and thought content.   ?Respiratory: Normal respiratory effort, no problems with respiration noted  ?Rest of physical exam deferred due to type of encounter ? ?OB:  No contractions ?        Baby moving consistently daily ?       Denies leaking or bleeding ? ?Labs and Imaging ?No results found for this or any previous visit (from the past 336 hour(s)). ?No results found.   ?  ?Assessment and Plan:  ?   ?1. Encounter for supervision of normal first pregnancy i ? [redacted]w[redacted]d normal pregnancy ? ?2. Abnormal finding on antenatal screen ? Normal ultrasound ? Normal glucola   ?  ?I discussed the assessment and treatment plan with the patient. The patient was provided an opportunity to ask questions and all were answered.  The patient agreed with the plan and demonstrated an understanding of the instructions. ?  ?The patient was advised to call back or seek an in-person evaluation/go to the ED if the symptoms worsen or if the condition fails to improve as anticipated. ? ?I provided 7 minutes of face-to-face time during this encounter. ? ? ?Hansel Feinstein, CNM ?[facility] [place] ? ? ?

## 2022-02-26 ENCOUNTER — Telehealth (INDEPENDENT_AMBULATORY_CARE_PROVIDER_SITE_OTHER): Payer: Medicaid Other | Admitting: Advanced Practice Midwife

## 2022-02-26 VITALS — BP 112/70 | HR 95

## 2022-02-26 DIAGNOSIS — Z3A34 34 weeks gestation of pregnancy: Secondary | ICD-10-CM

## 2022-02-26 DIAGNOSIS — O289 Unspecified abnormal findings on antenatal screening of mother: Secondary | ICD-10-CM

## 2022-02-26 DIAGNOSIS — Z3401 Encounter for supervision of normal first pregnancy, first trimester: Secondary | ICD-10-CM

## 2022-02-26 NOTE — Progress Notes (Signed)
ROB 34.[redacted] wks GA ?No unusual complaints ?

## 2022-02-26 NOTE — Progress Notes (Signed)
? ?  OBSTETRICS PRENATAL VIRTUAL VISIT ENCOUNTER NOTE ? ?Provider location: Center for [employer] at [facility]  ? ?Patient location: Home ? ?I connected with Tonya Wilkinson on 02/26/22 at 10:35 AM EDT by MyChart Video Encounter and verified that I am speaking with the correct person using two identifiers. I discussed the limitations, risks, security and privacy concerns of performing an evaluation and management service virtually and the availability of in person appointments. I also discussed with the patient that there may be a patient responsible charge related to this service. The patient expressed understanding and agreed to proceed. ?Subjective:  ?Tonya Wilkinson is a 21 y.o. G1P0 at [redacted]w[redacted]d being seen today for ongoing prenatal care.  She is currently monitored for the following issues for this low-risk pregnancy and has Encounter for supervision of normal first pregnancy in first trimester and Abnormal finding on antenatal screen on their problem list. ? ?Patient reports no complaints.  Contractions: Not present.  .  Movement: Present. Denies any leaking of fluid.  ? ?The following portions of the patient's history were reviewed and updated as appropriate: allergies, current medications, past family history, past medical history, past social history, past surgical history and problem list.  ? ?Objective:  ? ?Vitals:  ? 02/26/22 1041  ?BP: 112/70  ?Pulse: 95  ? ? ?Fetal Status:     Movement: Present    ? ?General:  Alert, oriented and cooperative. Patient is in no acute distress.  ?Respiratory: Normal respiratory effort, no problems with respiration noted  ?Mental Status: Normal mood and affect. Normal behavior. Normal judgment and thought content.  ?Rest of physical exam deferred due to type of encounter ? ?Imaging: ?No results found. ? ?Assessment and Plan:  ?Pregnancy: G1P0 at [redacted]w[redacted]d ?1. Encounter for supervision of normal first pregnancy in first trimester ?    Doing well, wonders who will  be her delivery provider, discussed rotating call ? ?2. Abnormal finding on antenatal screen ?    Followed by MFM, no abnormalities seen on [place], good growth ? ?Preterm labor symptoms and general obstetric precautions including but not limited to vaginal bleeding, contractions, leaking of fluid and fetal movement were reviewed in detail with the patient. ?I discussed the assessment and treatment plan with the patient. The patient was provided an opportunity to ask questions and all were answered. The patient agreed with the plan and demonstrated an understanding of the instructions. The patient was advised to call back or seek an in-person office evaluation/go to [facility]U at [facility] for any urgent or concerning symptoms. ?Please refer to After Visit Summary for other counseling recommendations.  ? ?I provided 8 minutes of face-to-face time during this encounter. ? ? ? ?Future Appointments  ?Date Time Provider Department Center  ?03/12/2022 10:35 AM Aviva Signs, CNM [facility] None  ?03/19/2022 10:55 AM Aviva Signs, CNM [facility] None  ?03/26/2022  8:15 AM Aviva Signs, CNM [facility] None  ?04/02/2022  9:35 AM Aviva Signs, CNM [facility] None  ? ? ?Wynelle Bourgeois, CNM ?Center for [employer], [facility] ? ?

## 2022-03-12 ENCOUNTER — Other Ambulatory Visit (HOSPITAL_COMMUNITY)
Admission: RE | Admit: 2022-03-12 | Discharge: 2022-03-12 | Disposition: A | Discharge status: Home / Self Care | Payer: Medicaid Other | Source: Ambulatory Visit | Attending: Advanced Practice Midwife | Admitting: Advanced Practice Midwife

## 2022-03-12 ENCOUNTER — Encounter: Payer: Self-pay | Admitting: Advanced Practice Midwife

## 2022-03-12 ENCOUNTER — Ambulatory Visit (INDEPENDENT_AMBULATORY_CARE_PROVIDER_SITE_OTHER): Payer: Medicaid Other | Admitting: Advanced Practice Midwife

## 2022-03-12 VITALS — BP 114/73 | HR 89 | Wt 137.0 lb

## 2022-03-12 DIAGNOSIS — Z3403 Encounter for supervision of normal first pregnancy, third trimester: Secondary | ICD-10-CM

## 2022-03-12 DIAGNOSIS — Z3A36 36 weeks gestation of pregnancy: Secondary | ICD-10-CM | POA: Diagnosis not present

## 2022-03-12 NOTE — Progress Notes (Signed)
? ?  PRENATAL VISIT NOTE ? ?Subjective:  ?Tonya Wilkinson is a 21 y.o. G1P0 at [redacted]w[redacted]d being seen today for ongoing prenatal care.  She is currently monitored for the following issues for this low-risk pregnancy and has Encounter for supervision of normal first pregnancy in first trimester and Abnormal finding on antenatal screen on their problem list. ? ?Patient reports occasional contractions.  Contractions: Not present. Vag. Bleeding: None.  Movement: Present. Denies leaking of fluid.  ? ?The following portions of the patient's history were reviewed and updated as appropriate: allergies, current medications, past family history, past medical history, past social history, past surgical history and problem list.  ? ?Objective:  ? ?Vitals:  ? 03/12/22 1044  ?BP: 114/73  ?Pulse: 89  ?Weight: 137 lb (62.1 kg)  ? ? ?Fetal Status: Fetal Heart Rate (bpm): 156   Movement: Present    ? ?General:  Alert, oriented and cooperative. Patient is in no acute distress.  ?Skin: Skin is warm and dry. No rash noted.   ?Cardiovascular: Normal heart rate noted  ?Respiratory: Normal respiratory effort, no problems with respiration noted  ?Abdomen: Soft, gravid, appropriate for gestational age.  Pain/Pressure: Absent     ?Pelvic: Cervical exam performed in the presence of a chaperone      Cervix 1/50/-3/vertex  ?Extremities: Normal range of motion.  Edema: None  ?Mental Status: Normal mood and affect. Normal behavior. Normal judgment and thought content.  ? ?Assessment and Plan:  ?Pregnancy: G1P0 at [redacted]w[redacted]d ?1. [redacted] weeks gestation of pregnancy ?    ?- Culture, beta strep (group b only) ?- GC/Chlamydia probe amp ([place])not at [facility] ? ?Term labor symptoms and general obstetric precautions including but not limited to vaginal bleeding, contractions, leaking of fluid and fetal movement were reviewed in detail with the patient. ?Please refer to After Visit Summary for other counseling recommendations.  ? ?Return in about 1 week (around  03/19/2022) for [facility]. ? ?Future Appointments  ?Date Time Provider [place]  ?03/19/2022 10:55 AM Seabron Spates, CNM [facility] None  ?03/26/2022  8:15 AM Seabron Spates, CNM [facility] None  ?04/02/2022  9:35 AM Seabron Spates, CNM [facility] None  ? ? ?Hansel Feinstein, CNM ?

## 2022-03-13 LAB — GC/CHLAMYDIA PROBE AMP (~~LOC~~) NOT AT ARMC
Chlamydia: NEGATIVE
Comment: NEGATIVE
Comment: NORMAL
Neisseria Gonorrhea: NEGATIVE

## 2022-03-16 LAB — CULTURE, BETA STREP (GROUP B ONLY): Strep Gp B Culture: NEGATIVE

## 2022-03-19 ENCOUNTER — Ambulatory Visit (INDEPENDENT_AMBULATORY_CARE_PROVIDER_SITE_OTHER): Payer: Medicaid Other | Admitting: Advanced Practice Midwife

## 2022-03-19 ENCOUNTER — Encounter: Payer: Self-pay | Admitting: Advanced Practice Midwife

## 2022-03-19 VITALS — BP 121/50 | HR 83 | Wt 138.0 lb

## 2022-03-19 DIAGNOSIS — Z3401 Encounter for supervision of normal first pregnancy, first trimester: Secondary | ICD-10-CM

## 2022-03-19 DIAGNOSIS — Z3A37 37 weeks gestation of pregnancy: Secondary | ICD-10-CM

## 2022-03-19 DIAGNOSIS — O289 Unspecified abnormal findings on antenatal screening of mother: Secondary | ICD-10-CM

## 2022-03-19 NOTE — Progress Notes (Signed)
? ?  PRENATAL VISIT NOTE ? ?Subjective:  ?Tonya Wilkinson is a 21 y.o. G1P0 at [redacted]w[redacted]d being seen today for ongoing prenatal care.  She is currently monitored for the following issues for this low-risk pregnancy and has Encounter for supervision of normal first pregnancy in first trimester and Abnormal finding on antenatal screen on their problem list. ? ?Patient reports no complaints.  Contractions: Not present. Vag. Bleeding: None.  Movement: Present. Denies leaking of fluid.  ? ?The following portions of the patient's history were reviewed and updated as appropriate: allergies, current medications, past family history, past medical history, past social history, past surgical history and problem list.  ? ?Objective:  ? ?Vitals:  ? 03/19/22 1052  ?BP: (!) 121/50  ?Pulse: 83  ?Weight: 138 lb (62.6 kg)  ? ? ?Fetal Status: Fetal Heart Rate (bpm): 145   Movement: Present    ? ?General:  Alert, oriented and cooperative. Patient is in no acute distress.  ?Skin: Skin is warm and dry. No rash noted.   ?Cardiovascular: Normal heart rate noted  ?Respiratory: Normal respiratory effort, no problems with respiration noted  ?Abdomen: Soft, gravid, appropriate for gestational age.  Pain/Pressure: Present     ?Pelvic: Cervical exam deferred        ?Extremities: Normal range of motion.  Edema: None  ?Mental Status: Normal mood and affect. Normal behavior. Normal judgment and thought content.  ? ?Assessment and Plan:  ?Pregnancy: G1P0 at [redacted]w[redacted]d ?1. Encounter for supervision of normal first pregnancy in first trimester ? ? ?2. Abnormal finding on antenatal screen ?    Deemed likely due to vanishing twin ?    US findings normal ? ?3. [redacted] weeks gestation of pregnancy ? ? ?Term labor symptoms and general obstetric precautions including but not limited to vaginal bleeding, contractions, leaking of fluid and fetal movement were reviewed in detail with the patient. ?Please refer to After Visit Summary for other counseling recommendations.   ? ?Return in about 1 week (around 03/26/2022) for [employer]. ? ?Future Appointments  ?Date Time Provider [place]  ?03/26/2022  8:15 AM Seabron Spates, CNM [facility] None  ?04/02/2022  9:35 AM Seabron Spates, CNM [facility] None  ? ? ?Hansel Feinstein, CNM ?

## 2022-03-26 ENCOUNTER — Encounter: Payer: Self-pay | Admitting: Advanced Practice Midwife

## 2022-03-26 ENCOUNTER — Ambulatory Visit (INDEPENDENT_AMBULATORY_CARE_PROVIDER_SITE_OTHER): Payer: Medicaid Other | Admitting: Advanced Practice Midwife

## 2022-03-26 VITALS — BP 101/77 | HR 73 | Wt 141.0 lb

## 2022-03-26 DIAGNOSIS — Z3A38 38 weeks gestation of pregnancy: Secondary | ICD-10-CM

## 2022-03-26 NOTE — Progress Notes (Signed)
? ?  PRENATAL VISIT NOTE ? ?Subjective:  ?Tonya Wilkinson is a 21 y.o. G1P0 at [redacted]w[redacted]d being seen today for ongoing prenatal care.  She is currently monitored for the following issues for this low-risk pregnancy and has Encounter for supervision of normal first pregnancy in first trimester and Abnormal finding on antenatal screen on their problem list. ? ?Patient reports occasional contractions.  Contractions: Not present. Vag. Bleeding: None.  Movement: Present. Denies leaking of fluid.  ? ?The following portions of the patient's history were reviewed and updated as appropriate: allergies, current medications, past family history, past medical history, past social history, past surgical history and problem list.  ? ?Objective:  ? ?Vitals:  ? 03/26/22 0827  ?BP: 101/77  ?Pulse: 73  ?Weight: 141 lb (64 kg)  ? ? ?Fetal Status:     Movement: Present    ? ?General:  Alert, oriented and cooperative. Patient is in no acute distress.  ?Skin: Skin is warm and dry. No rash noted.   ?Cardiovascular: Normal heart rate noted  ?Respiratory: Normal respiratory effort, no problems with respiration noted  ?Abdomen: Soft, gravid, appropriate for gestational age.  Pain/Pressure: Present     ?Pelvic: Cervical exam deferred        ?Extremities: Normal range of motion.  Edema: None  ?Mental Status: Normal mood and affect. Normal behavior. Normal judgment and thought content.  ? ?Assessment and Plan:  ?Pregnancy: G1P0 at [redacted]w[redacted]d ?1. [redacted] weeks gestation of pregnancy ?     Reviewed signs of labor and where to go ?     Plan exam and sweeping next visit ? ?Term labor symptoms and general obstetric precautions including but not limited to vaginal bleeding, contractions, leaking of fluid and fetal movement were reviewed in detail with the patient. ?Please refer to After Visit Summary for other counseling recommendations.  ? ?Return in about 1 week (around 04/02/2022) for [facility]. ? ?Future Appointments  ?Date Time Provider Department  Center  ?04/02/2022  9:35 AM Aviva Signs, CNM [facility] None  ? ? ?Wynelle Bourgeois, CNM ?

## 2022-04-02 ENCOUNTER — Ambulatory Visit (INDEPENDENT_AMBULATORY_CARE_PROVIDER_SITE_OTHER): Payer: Medicaid Other | Admitting: Advanced Practice Midwife

## 2022-04-02 VITALS — BP 130/88 | HR 82

## 2022-04-02 DIAGNOSIS — O289 Unspecified abnormal findings on antenatal screening of mother: Secondary | ICD-10-CM

## 2022-04-02 DIAGNOSIS — Z3A39 39 weeks gestation of pregnancy: Secondary | ICD-10-CM

## 2022-04-02 DIAGNOSIS — Z3401 Encounter for supervision of normal first pregnancy, first trimester: Secondary | ICD-10-CM

## 2022-04-02 NOTE — Progress Notes (Signed)
? ?  PRENATAL VISIT NOTE ? ?Subjective:  ?Tonya Wilkinson is a 21 y.o. G1P0 at [redacted]w[redacted]d being seen today for ongoing prenatal care.  She is currently monitored for the following issues for this low-risk pregnancy and has Encounter for supervision of normal first pregnancy in first trimester and Abnormal finding on antenatal screen on their problem list. ? ?Patient reports occasional contractions.  Contractions: Irritability. Vag. Bleeding: None.  Movement: Present. Denies leaking of fluid.  ? ?The following portions of the patient's history were reviewed and updated as appropriate: allergies, current medications, past family history, past medical history, past social history, past surgical history and problem list.  ? ?Objective:  ? ?Vitals:  ? 04/02/22 0946  ?BP: 130/88  ?Pulse: 82  ? ? ?Fetal Status: Fetal Heart Rate (bpm): 130   Movement: Present    ? ?General:  Alert, oriented and cooperative. Patient is in no acute distress.  ?Skin: Skin is warm and dry. No rash noted.   ?Cardiovascular: Normal heart rate noted  ?Respiratory: Normal respiratory effort, no problems with respiration noted  ?Abdomen: Soft, gravid, appropriate for gestational age.  Pain/Pressure: Present     ?Pelvic: Cervical exam performed in the presence of a chaperone      Cervix 1-2/80-90/-1/vertex  ?Extremities: Normal range of motion.  Edema: None  ?Mental Status: Normal mood and affect. Normal behavior. Normal judgment and thought content.  ? ?Assessment and Plan:  ?Pregnancy: G1P0 at [redacted]w[redacted]d ?1. [redacted] weeks gestation of pregnancy ?    Attempted membrane sweep, one sweep tolerated,  cervix soft and effaced ? ?2. Encounter for supervision of normal first pregnancy in first trimester ?  ? ?3. Abnormal finding on antenatal screen ?   Normal exams since then ? ?Term labor symptoms and general obstetric precautions including but not limited to vaginal bleeding, contractions, leaking of fluid and fetal movement were reviewed in detail with the  patient. ?Please refer to After Visit Summary for other counseling recommendations.  ? ? ? ?Wynelle Bourgeois, CNM ?

## 2022-04-09 ENCOUNTER — Ambulatory Visit (INDEPENDENT_AMBULATORY_CARE_PROVIDER_SITE_OTHER): Payer: Medicaid Other | Admitting: Advanced Practice Midwife

## 2022-04-09 ENCOUNTER — Telehealth (HOSPITAL_COMMUNITY): Payer: Self-pay | Admitting: *Deleted

## 2022-04-09 ENCOUNTER — Encounter (HOSPITAL_COMMUNITY): Payer: Self-pay

## 2022-04-09 VITALS — BP 127/77 | HR 81 | Wt 141.0 lb

## 2022-04-09 DIAGNOSIS — Z3401 Encounter for supervision of normal first pregnancy, first trimester: Secondary | ICD-10-CM

## 2022-04-09 DIAGNOSIS — Z3A40 40 weeks gestation of pregnancy: Secondary | ICD-10-CM

## 2022-04-09 DIAGNOSIS — O289 Unspecified abnormal findings on antenatal screening of mother: Secondary | ICD-10-CM

## 2022-04-09 NOTE — Telephone Encounter (Signed)
Preadmission screen  

## 2022-04-09 NOTE — Progress Notes (Signed)
   PRENATAL VISIT NOTE  Subjective:  Tonya Wilkinson is a 21 y.o. G1P0 at [redacted]w[redacted]d being seen today for ongoing prenatal care.  She is currently monitored for the following issues for this low-risk pregnancy and has Encounter for supervision of normal first pregnancy in first trimester and Abnormal finding on antenatal screen on their problem list.  Patient reports occasional contractions.  Contractions: Irritability. Vag. Bleeding: None.  Movement: Present. Denies leaking of fluid.   The following portions of the patient's history were reviewed and updated as appropriate: allergies, current medications, past family history, past medical history, past social history, past surgical history and problem list.   Objective:   Vitals:   04/09/22 0952  BP: 127/77  Pulse: 81  Weight: 141 lb (64 kg)    Fetal Status:     Movement: Present     General:  Alert, oriented and cooperative. Patient is in no acute distress.  Skin: Skin is warm and dry. No rash noted.   Cardiovascular: Normal heart rate noted  Respiratory: Normal respiratory effort, no problems with respiration noted  Abdomen: Soft, gravid, appropriate for gestational age.  Pain/Pressure: Present     Pelvic: Cervical exam deferred        Extremities: Normal range of motion.  Edema: None  Mental Status: Normal mood and affect. Normal behavior. Normal judgment and thought content.   Assessment and Plan:  Pregnancy: G1P0 at [redacted]w[redacted]d 1. [redacted] weeks gestation of pregnancy    Scheduled for IOL on Sunday  2. Encounter for supervision of normal first pregnancy in first trimester   3. Abnormal finding on antenatal screen     Screening abnormal (Panorama, HR Triploidy) but Subsequent USs were normal   Felt to be due to vanishing twin  Term labor symptoms and general obstetric precautions including but not limited to vaginal bleeding, contractions, leaking of fluid and fetal movement were reviewed in detail with the patient. Please refer to After  Visit Summary for other counseling recommendations.     Future Appointments  Date Time Provider Department Center  04/14/2022  6:30 AM [facility] SCHED ROOM [facility] None  05/28/2022  9:55 AM Aviva Signs, CNM [facility] None    Wynelle Bourgeois, CNM

## 2022-04-10 ENCOUNTER — Telehealth (HOSPITAL_COMMUNITY): Payer: Self-pay | Admitting: *Deleted

## 2022-04-10 NOTE — Telephone Encounter (Signed)
Preadmission screen  

## 2022-04-11 ENCOUNTER — Other Ambulatory Visit: Payer: Self-pay | Admitting: Advanced Practice Midwife

## 2022-04-11 ENCOUNTER — Encounter (HOSPITAL_COMMUNITY): Payer: Self-pay | Admitting: Obstetrics and Gynecology

## 2022-04-11 ENCOUNTER — Inpatient Hospital Stay (HOSPITAL_COMMUNITY): Payer: Medicaid Other | Admitting: Anesthesiology

## 2022-04-11 ENCOUNTER — Other Ambulatory Visit: Payer: Self-pay

## 2022-04-11 ENCOUNTER — Inpatient Hospital Stay (HOSPITAL_COMMUNITY)
Admission: AD | Admit: 2022-04-11 | Discharge: 2022-04-13 | DRG: 807 | Disposition: A | Discharge status: Home / Self Care | Payer: Medicaid Other | Attending: Family Medicine | Admitting: Family Medicine

## 2022-04-11 DIAGNOSIS — O289 Unspecified abnormal findings on antenatal screening of mother: Secondary | ICD-10-CM | POA: Diagnosis present

## 2022-04-11 DIAGNOSIS — O133 Gestational [pregnancy-induced] hypertension without significant proteinuria, third trimester: Principal | ICD-10-CM

## 2022-04-11 DIAGNOSIS — Z3401 Encounter for supervision of normal first pregnancy, first trimester: Secondary | ICD-10-CM

## 2022-04-11 DIAGNOSIS — Z3A40 40 weeks gestation of pregnancy: Secondary | ICD-10-CM

## 2022-04-11 DIAGNOSIS — O480 Post-term pregnancy: Secondary | ICD-10-CM | POA: Diagnosis present

## 2022-04-11 DIAGNOSIS — O139 Gestational [pregnancy-induced] hypertension without significant proteinuria, unspecified trimester: Secondary | ICD-10-CM | POA: Diagnosis present

## 2022-04-11 DIAGNOSIS — O134 Gestational [pregnancy-induced] hypertension without significant proteinuria, complicating childbirth: Secondary | ICD-10-CM | POA: Diagnosis present

## 2022-04-11 DIAGNOSIS — O326XX1 Maternal care for compound presentation, fetus 1: Secondary | ICD-10-CM | POA: Diagnosis not present

## 2022-04-11 DIAGNOSIS — O479 False labor, unspecified: Secondary | ICD-10-CM

## 2022-04-11 DIAGNOSIS — Z30430 Encounter for insertion of intrauterine contraceptive device: Secondary | ICD-10-CM | POA: Diagnosis not present

## 2022-04-11 DIAGNOSIS — O26893 Other specified pregnancy related conditions, third trimester: Secondary | ICD-10-CM | POA: Diagnosis present

## 2022-04-11 LAB — CBC
HCT: 39.3 % (ref 36.0–46.0)
Hemoglobin: 13.5 g/dL (ref 12.0–15.0)
MCH: 28.9 pg (ref 26.0–34.0)
MCHC: 34.4 g/dL (ref 30.0–36.0)
MCV: 84.2 fL (ref 80.0–100.0)
Platelets: 259 10*3/uL (ref 150–400)
RBC: 4.67 MIL/uL (ref 3.87–5.11)
RDW: 13.3 % (ref 11.5–15.5)
WBC: 8.3 10*3/uL (ref 4.0–10.5)
nRBC: 0 % (ref 0.0–0.2)

## 2022-04-11 LAB — COMPREHENSIVE METABOLIC PANEL
ALT: 25 U/L (ref 0–44)
AST: 26 U/L (ref 15–41)
Albumin: 2.9 g/dL — ABNORMAL LOW (ref 3.5–5.0)
Alkaline Phosphatase: 183 U/L — ABNORMAL HIGH (ref 38–126)
Anion gap: 8 (ref 5–15)
BUN: <5 mg/dL — ABNORMAL LOW (ref 6–20)
CO2: 20 mmol/L — ABNORMAL LOW (ref 22–32)
Calcium: 8.8 mg/dL — ABNORMAL LOW (ref 8.9–10.3)
Chloride: 105 mmol/L (ref 98–111)
Creatinine, Ser: 0.72 mg/dL (ref 0.44–1.00)
GFR, Estimated: >60 mL/min (ref >60)
Glucose, Bld: 81 mg/dL (ref 70–99)
Potassium: 3.4 mmol/L — ABNORMAL LOW (ref 3.5–5.1)
Sodium: 133 mmol/L — ABNORMAL LOW (ref 135–145)
Total Bilirubin: 0.5 mg/dL (ref 0.3–1.2)
Total Protein: 6.9 g/dL (ref 6.5–8.1)

## 2022-04-11 LAB — TYPE AND SCREEN
ABO/RH(D): A POS
Antibody Screen: NEGATIVE

## 2022-04-11 LAB — PROTEIN / CREATININE RATIO, URINE
Creatinine, Urine: 170.43 mg/dL
Protein Creatinine Ratio: 0.14 mg/mg{Cre} (ref 0.00–0.15)
Total Protein, Urine: 24 mg/dL

## 2022-04-11 MED ORDER — OXYTOCIN-SODIUM CHLORIDE 30-0.9 UT/500ML-% IV SOLN
1.0000 m[IU]/min | INTRAVENOUS | Status: DC
  Administered 2022-04-11: 2 m[IU]/min via INTRAVENOUS
  Filled 2022-04-11: qty 500

## 2022-04-11 MED ORDER — ONDANSETRON HCL 4 MG/2ML IJ SOLN
4.0000 mg | Freq: Four times a day (QID) | INTRAMUSCULAR | Status: DC | PRN
  Administered 2022-04-11: 4 mg via INTRAVENOUS
  Filled 2022-04-11: qty 2

## 2022-04-11 MED ORDER — FENTANYL-BUPIVACAINE-NACL 0.5-0.125-0.9 MG/250ML-% EP SOLN
EPIDURAL | Status: DC | PRN
  Administered 2022-04-11: 12 mL/h via EPIDURAL

## 2022-04-11 MED ORDER — LEVONORGESTREL 20 MCG/DAY IU IUD
1.0000 | INTRAUTERINE_SYSTEM | Freq: Once | INTRAUTERINE | Status: AC
  Administered 2022-04-11: 1 via INTRAUTERINE

## 2022-04-11 MED ORDER — PHENYLEPHRINE 80 MCG/ML (10ML) SYRINGE FOR IV PUSH (FOR BLOOD PRESSURE SUPPORT): 80.0000 ug | PREFILLED_SYRINGE | INTRAVENOUS | Status: DC | PRN

## 2022-04-11 MED ORDER — ACETAMINOPHEN 325 MG PO TABS
650.0000 mg | ORAL_TABLET | ORAL | Status: DC | PRN
  Administered 2022-04-11: 650 mg via ORAL
  Filled 2022-04-11: qty 2

## 2022-04-11 MED ORDER — BENZOCAINE-MENTHOL 20-0.5 % EX AERO: 1.0000 "application " | INHALATION_SPRAY | CUTANEOUS | Status: DC | PRN

## 2022-04-11 MED ORDER — OXYTOCIN-SODIUM CHLORIDE 30-0.9 UT/500ML-% IV SOLN
2.5000 [IU]/h | INTRAVENOUS | Status: DC
  Administered 2022-04-11: 2.5 [IU]/h via INTRAVENOUS

## 2022-04-11 MED ORDER — OXYCODONE-ACETAMINOPHEN 5-325 MG PO TABS: 1.0000 | ORAL_TABLET | ORAL | Status: DC | PRN

## 2022-04-11 MED ORDER — WITCH HAZEL-GLYCERIN EX PADS
1.0000 "application " | MEDICATED_PAD | CUTANEOUS | Status: DC | PRN
  Administered 2022-04-12: 1 via TOPICAL

## 2022-04-11 MED ORDER — LACTATED RINGERS IV SOLN
500.0000 mL | Freq: Once | INTRAVENOUS | Status: AC
  Administered 2022-04-11: 500 mL via INTRAVENOUS

## 2022-04-11 MED ORDER — SOD CITRATE-CITRIC ACID 500-334 MG/5ML PO SOLN: 30.0000 mL | ORAL | Status: DC | PRN

## 2022-04-11 MED ORDER — SENNOSIDES-DOCUSATE SODIUM 8.6-50 MG PO TABS
2.0000 | ORAL_TABLET | Freq: Every day | ORAL | Status: DC
  Administered 2022-04-12 – 2022-04-13 (×2): 2 via ORAL
  Filled 2022-04-11 (×2): qty 2

## 2022-04-11 MED ORDER — DIPHENHYDRAMINE HCL 50 MG/ML IJ SOLN: 12.5000 mg | INTRAMUSCULAR | Status: DC | PRN

## 2022-04-11 MED ORDER — TETANUS-DIPHTH-ACELL PERTUSSIS 5-2.5-18.5 LF-MCG/0.5 IM SUSY: 0.5000 mL | PREFILLED_SYRINGE | Freq: Once | INTRAMUSCULAR | Status: DC

## 2022-04-11 MED ORDER — PRENATAL MULTIVITAMIN CH
1.0000 | ORAL_TABLET | Freq: Every day | ORAL | Status: DC
  Administered 2022-04-12 – 2022-04-13 (×2): 1 via ORAL
  Filled 2022-04-11 (×2): qty 1

## 2022-04-11 MED ORDER — LIDOCAINE HCL (PF) 1 % IJ SOLN
INTRAMUSCULAR | Status: DC | PRN
  Administered 2022-04-11: 10 mL via EPIDURAL
  Administered 2022-04-11: 2 mL via EPIDURAL

## 2022-04-11 MED ORDER — TERBUTALINE SULFATE 1 MG/ML IJ SOLN
0.2500 mg | Freq: Once | INTRAMUSCULAR | Status: DC | PRN
Start: 2022-04-11 — End: 2022-04-11

## 2022-04-11 MED ORDER — FENTANYL CITRATE (PF) 100 MCG/2ML IJ SOLN
100.0000 ug | INTRAMUSCULAR | Status: DC | PRN
  Administered 2022-04-11 (×2): 100 ug via INTRAVENOUS
  Filled 2022-04-11 (×2): qty 2

## 2022-04-11 MED ORDER — FENTANYL-BUPIVACAINE-NACL 0.5-0.125-0.9 MG/250ML-% EP SOLN
EPIDURAL | Status: AC
  Filled 2022-04-11: qty 250

## 2022-04-11 MED ORDER — OXYTOCIN BOLUS FROM INFUSION
333.0000 mL | Freq: Once | INTRAVENOUS | Status: AC
  Administered 2022-04-11: 333 mL via INTRAVENOUS

## 2022-04-11 MED ORDER — SIMETHICONE 80 MG PO CHEW
80.0000 mg | CHEWABLE_TABLET | ORAL | Status: DC | PRN
Start: 2022-04-11 — End: 2022-04-13

## 2022-04-11 MED ORDER — OXYCODONE-ACETAMINOPHEN 5-325 MG PO TABS: 2.0000 | ORAL_TABLET | ORAL | Status: DC | PRN

## 2022-04-11 MED ORDER — DIPHENHYDRAMINE HCL 25 MG PO CAPS: 25.0000 mg | ORAL_CAPSULE | Freq: Four times a day (QID) | ORAL | Status: DC | PRN

## 2022-04-11 MED ORDER — ACETAMINOPHEN 325 MG PO TABS
650.0000 mg | ORAL_TABLET | ORAL | Status: DC | PRN
  Administered 2022-04-12: 650 mg via ORAL
  Filled 2022-04-11: qty 2

## 2022-04-11 MED ORDER — IBUPROFEN 600 MG PO TABS
600.0000 mg | ORAL_TABLET | Freq: Four times a day (QID) | ORAL | Status: DC
  Administered 2022-04-11 – 2022-04-13 (×6): 600 mg via ORAL
  Filled 2022-04-11 (×6): qty 1

## 2022-04-11 MED ORDER — LIDOCAINE HCL (PF) 1 % IJ SOLN: 30.0000 mL | INTRAMUSCULAR | Status: DC | PRN

## 2022-04-11 MED ORDER — FENTANYL-BUPIVACAINE-NACL 0.5-0.125-0.9 MG/250ML-% EP SOLN: 12.0000 mL/h | EPIDURAL | Status: DC | PRN

## 2022-04-11 MED ORDER — LACTATED RINGERS IV BOLUS
1000.0000 mL | INTRAVENOUS | Status: AC
  Administered 2022-04-11: 1000 mL via INTRAVENOUS

## 2022-04-11 MED ORDER — DIBUCAINE (PERIANAL) 1 % EX OINT: 1.0000 "application " | TOPICAL_OINTMENT | CUTANEOUS | Status: DC | PRN

## 2022-04-11 MED ORDER — ONDANSETRON HCL 4 MG PO TABS: 4.0000 mg | ORAL_TABLET | ORAL | Status: DC | PRN

## 2022-04-11 MED ORDER — EPHEDRINE 5 MG/ML INJ: 10.0000 mg | INTRAVENOUS | Status: DC | PRN

## 2022-04-11 MED ORDER — FENTANYL CITRATE (PF) 100 MCG/2ML IJ SOLN: 100.0000 ug | INTRAMUSCULAR | Status: DC | PRN

## 2022-04-11 MED ORDER — ONDANSETRON HCL 4 MG/2ML IJ SOLN: 4.0000 mg | INTRAMUSCULAR | Status: DC | PRN

## 2022-04-11 MED ORDER — COCONUT OIL OIL
1.0000 "application " | TOPICAL_OIL | Status: DC | PRN
  Administered 2022-04-13: 1 via TOPICAL

## 2022-04-11 MED ORDER — MISOPROSTOL 25 MCG QUARTER TABLET: 25.0000 ug | ORAL_TABLET | ORAL | Status: DC | PRN

## 2022-04-11 MED ORDER — LACTATED RINGERS IV SOLN: INTRAVENOUS | Status: DC

## 2022-04-11 MED ORDER — LACTATED RINGERS IV SOLN: 500.0000 mL | INTRAVENOUS | Status: DC | PRN

## 2022-04-11 NOTE — MAU Provider Note (Signed)
Chief Complaint:  Labor Eval   Event Date/Time   First Provider Initiated Contact with Patient 04/11/22 0157     HPI: Tonya Wilkinson is a 21 y.o. G1P0 at 52w4dwho presents to maternity admissions reporting painful contractions.  Noted to have hypertension while being assessed, which is new.. She reports good fetal movement, denies LOF, vaginal bleeding, vaginal itching/burning, urinary symptoms, h/a, dizziness, n/v, diarrhea, constipation or fever/chills.  She denies headache, visual changes or RUQ abdominal pain.  Abdominal Pain This is a new problem. The current episode started today. The problem occurs intermittently. The quality of the pain is described as cramping. The pain does not radiate. Pertinent negatives include no fever, headaches, myalgias or nausea. Nothing relieves the symptoms. Past treatments include nothing.   Past Medical History: Past Medical History:  Diagnosis Date   Medical history non-contributory     Past obstetric history: OB History  Gravida Para Term Preterm AB Living  1            SAB IAB Ectopic Multiple Live Births               # Outcome Date GA Lbr Len/2nd Weight Sex Delivery Anes PTL Lv  1 Current             Past Surgical History: History reviewed. No pertinent surgical history.  Family History: Family History  Problem Relation Age of Onset   Hypertension Sister    Cancer Neg Hx    Diabetes Neg Hx     Social History: Social History   Tobacco Use   Smoking status: Never   Smokeless tobacco: Never  Vaping Use   Vaping Use: Never used  Substance Use Topics   Alcohol use: Never   Drug use: Never    Allergies: No Known Allergies  Meds:  Medications Prior to Admission  Medication Sig Dispense Refill Last Dose   Prenatal Vit-Fe Fumarate-FA (MULTIVITAMIN-PRENATAL) 27-0.8 MG TABS tablet Take 1 tablet by mouth daily at 12 noon.   04/10/2022    I have reviewed patient's Past Medical Hx, Surgical Hx, Family Hx, Social Hx, medications  and allergies.   ROS:  Review of Systems  Constitutional:  Negative for fever.  Gastrointestinal:  Positive for abdominal pain. Negative for nausea.  Musculoskeletal:  Negative for myalgias.  Neurological:  Negative for headaches.  Other systems negative  Physical Exam  Patient Vitals for the past 24 hrs:  BP Temp Temp src Pulse Resp SpO2 Height Weight  04/11/22 0145 (!) 149/94 -- -- (!) 112 -- 99 % -- --  04/11/22 0130 130/88 -- -- 100 -- 99 % -- --  04/11/22 0122 (!) 144/93 -- -- (!) 118 18 99 % -- --  04/11/22 0102 132/76 98.4 F (36.9 C) Oral (!) 111 18 100 % 5\' 3"  (1.6 m) 64.7 kg   Vitals:   04/11/22 0130 04/11/22 0145 04/11/22 0211 04/11/22 0230  BP: 130/88 (!) 149/94 (!) 140/121 (!) 142/94  Pulse: 100 (!) 112 82 91  Resp:      Temp:      TempSrc:      SpO2: 99% 99% 99% 98%  Weight:      Height:       Vitals:   04/11/22 0315 04/11/22 0330 04/11/22 0345 04/11/22 0400  BP: (!) 142/95 (!) 154/97 (!) 142/96 132/77  Pulse: 87 79 90 89  Resp:      Temp:      TempSrc:      SpO2: 99% 99%  100% 99%  Weight:      Height:         Constitutional: Well-developed, well-nourished female in no acute distress.  Cardiovascular: normal rate  Respiratory: normal effort GI: Abd soft, non-tender, gravid appropriate for gestational age.   No rebound or guarding. MS: Extremities nontender, no edema, normal ROM Neurologic: Alert and oriented x 4.  GU: Neg CVAT.  PELVIC EXAM:  Dilation: 3.5 Effacement (%): 80 Cervical Position: Posterior Station: -1 Presentation: Vertex Exam by:: Santiago Bur, RN  FHT:  Baseline 140 , moderate variability, accelerations present, no decelerations Contractions: q 3-4 mins Irregular  Rare   Labs: Results for orders placed or performed during the hospital encounter of 04/11/22 (from the past 24 hour(s))  Protein / creatinine ratio, urine     Status: None   Collection Time: 04/11/22  1:46 AM  Result Value Ref Range   Creatinine, Urine 170.43  mg/dL   Total Protein, Urine 24 mg/dL   Protein Creatinine Ratio 0.14 0.00 - 0.15 mg/mg[Cre]  CBC     Status: None   Collection Time: 04/11/22  1:49 AM  Result Value Ref Range   WBC 8.3 4.0 - 10.5 K/uL   RBC 4.67 3.87 - 5.11 MIL/uL   Hemoglobin 13.5 12.0 - 15.0 g/dL   HCT 40.9 81.1 - 91.4 %   MCV 84.2 80.0 - 100.0 fL   MCH 28.9 26.0 - 34.0 pg   MCHC 34.4 30.0 - 36.0 g/dL   RDW 78.2 95.6 - 21.3 %   Platelets 259 150 - 400 K/uL   nRBC 0.0 0.0 - 0.2 %  Comprehensive metabolic panel     Status: Abnormal   Collection Time: 04/11/22  1:49 AM  Result Value Ref Range   Sodium 133 (L) 135 - 145 mmol/L   Potassium 3.4 (L) 3.5 - 5.1 mmol/L   Chloride 105 98 - 111 mmol/L   CO2 20 (L) 22 - 32 mmol/L   Glucose, Bld 81 70 - 99 mg/dL   BUN <5 (L) 6 - 20 mg/dL   Creatinine, Ser 0.86 0.44 - 1.00 mg/dL   Calcium 8.8 (L) 8.9 - 10.3 mg/dL   Total Protein 6.9 6.5 - 8.1 g/dL   Albumin 2.9 (L) 3.5 - 5.0 g/dL   AST 26 15 - 41 U/L   ALT 25 0 - 44 U/L   Alkaline Phosphatase 183 (H) 38 - 126 U/L   Total Bilirubin 0.5 0.3 - 1.2 mg/dL   GFR, Estimated >57 >84 mL/min   Anion gap 8 5 - 15    A/Positive/-- (10/26 1519)  Imaging:  No results found.  MAU Course/MDM: I have reviewed the triage vital signs and the nursing notes.   Pertinent labs & imaging results that were available during my care of the patient were reviewed by me and considered in my medical decision making (see chart for details).      I have reviewed her medical records including past results, notes and treatments.   I have ordered labs and reviewed results. Labs are normal  BPs remain elevated over the past 4 hours  Labor has diminished in frequency. There was no change over 2 hours.  NST reviewed, reassuring Consult Dr Vergie Living with presentation, exam findings and test results. He recommends induction  of labor.  Labor unit is full, will transfer when able Treatments in MAU included EFM.    Assessment: Single IUP at  [redacted]w[redacted]d Latent Labor Gestational Hypertension Postdates pregnancy  Plan: Admit to Labor  and Delivery Routine orders Cervix too advanced for Foley, will proceed with Pitocin Anticipate SVD.  Mccall Lomax CNM, MSN Certified Nurse-Midwife 04/11/2022 1:57 AM Updated at 0521AM 

## 2022-04-11 NOTE — MAU Note (Signed)
..  Tonya Wilkinson is a 21 y.o. at [redacted]w[redacted]d here in MAU reporting: abdominal cramping that comes and goes every 7 minutes. +FM. Denies vaginal bleeding or leaking of fluid.    Pain score: 6/10 Vitals:   04/11/22 0102  BP: 132/76  Pulse: (!) 111  Resp: 18  Temp: 98.4 F (36.9 C)  SpO2: 100%     FHT:147

## 2022-04-11 NOTE — Anesthesia Preprocedure Evaluation (Signed)
Anesthesia Evaluation  Patient identified by MRN, date of birth, ID band Patient awake    Reviewed: Allergy & Precautions, Patient's Chart, lab work & pertinent test results  Airway Mallampati: II  TM Distance: >3 FB Neck ROM: Full    Dental no notable dental hx.    Pulmonary neg pulmonary ROS,    Pulmonary exam normal breath sounds clear to auscultation       Cardiovascular negative cardio ROS Normal cardiovascular exam Rhythm:Regular Rate:Normal     Neuro/Psych negative neurological ROS  negative psych ROS   GI/Hepatic negative GI ROS, Neg liver ROS,   Endo/Other  negative endocrine ROS  Renal/GU negative Renal ROS  negative genitourinary   Musculoskeletal negative musculoskeletal ROS (+)   Abdominal   Peds  Hematology negative hematology ROS (+) hct 39.3, plt 259   Anesthesia Other Findings   Reproductive/Obstetrics (+) Pregnancy                             Anesthesia Physical Anesthesia Plan  ASA: 2  Anesthesia Plan: Epidural   Post-op Pain Management:    Induction:   PONV Risk Score and Plan:   Airway Management Planned: Natural Airway  Additional Equipment: None  Intra-op Plan:   Post-operative Plan:   Informed Consent: I have reviewed the patients History and Physical, chart, labs and discussed the procedure including the risks, benefits and alternatives for the proposed anesthesia with the patient or authorized representative who has indicated his/her understanding and acceptance.       Plan Discussed with:   Anesthesia Plan Comments:         Anesthesia Quick Evaluation

## 2022-04-11 NOTE — H&P (Addendum)
Chief Complaint:  Labor Eval   Event Date/Time   First Provider Initiated Contact with Patient 04/11/22 0157     HPI: Tonya Wilkinson is a 21 y.o. G1P0 at 52w4dwho presents to maternity admissions reporting painful contractions.  Noted to have hypertension while being assessed, which is new.. She reports good fetal movement, denies LOF, vaginal bleeding, vaginal itching/burning, urinary symptoms, h/a, dizziness, n/v, diarrhea, constipation or fever/chills.  She denies headache, visual changes or RUQ abdominal pain.  Abdominal Pain This is a new problem. The current episode started today. The problem occurs intermittently. The quality of the pain is described as cramping. The pain does not radiate. Pertinent negatives include no fever, headaches, myalgias or nausea. Nothing relieves the symptoms. Past treatments include nothing.   Past Medical History: Past Medical History:  Diagnosis Date   Medical history non-contributory     Past obstetric history: OB History  Gravida Para Term Preterm AB Living  1            SAB IAB Ectopic Multiple Live Births               # Outcome Date GA Lbr Len/2nd Weight Sex Delivery Anes PTL Lv  1 Current             Past Surgical History: History reviewed. No pertinent surgical history.  Family History: Family History  Problem Relation Age of Onset   Hypertension Sister    Cancer Neg Hx    Diabetes Neg Hx     Social History: Social History   Tobacco Use   Smoking status: Never   Smokeless tobacco: Never  Vaping Use   Vaping Use: Never used  Substance Use Topics   Alcohol use: Never   Drug use: Never    Allergies: No Known Allergies  Meds:  Medications Prior to Admission  Medication Sig Dispense Refill Last Dose   Prenatal Vit-Fe Fumarate-FA (MULTIVITAMIN-PRENATAL) 27-0.8 MG TABS tablet Take 1 tablet by mouth daily at 12 noon.   04/10/2022    I have reviewed patient's Past Medical Hx, Surgical Hx, Family Hx, Social Hx, medications  and allergies.   ROS:  Review of Systems  Constitutional:  Negative for fever.  Gastrointestinal:  Positive for abdominal pain. Negative for nausea.  Musculoskeletal:  Negative for myalgias.  Neurological:  Negative for headaches.  Other systems negative  Physical Exam  Patient Vitals for the past 24 hrs:  BP Temp Temp src Pulse Resp SpO2 Height Weight  04/11/22 0145 (!) 149/94 -- -- (!) 112 -- 99 % -- --  04/11/22 0130 130/88 -- -- 100 -- 99 % -- --  04/11/22 0122 (!) 144/93 -- -- (!) 118 18 99 % -- --  04/11/22 0102 132/76 98.4 F (36.9 C) Oral (!) 111 18 100 % 5\' 3"  (1.6 m) 64.7 kg   Vitals:   04/11/22 0130 04/11/22 0145 04/11/22 0211 04/11/22 0230  BP: 130/88 (!) 149/94 (!) 140/121 (!) 142/94  Pulse: 100 (!) 112 82 91  Resp:      Temp:      TempSrc:      SpO2: 99% 99% 99% 98%  Weight:      Height:       Vitals:   04/11/22 0315 04/11/22 0330 04/11/22 0345 04/11/22 0400  BP: (!) 142/95 (!) 154/97 (!) 142/96 132/77  Pulse: 87 79 90 89  Resp:      Temp:      TempSrc:      SpO2: 99% 99%  100% 99%  Weight:      Height:         Constitutional: Well-developed, well-nourished female in no acute distress.  Cardiovascular: normal rate  Respiratory: normal effort GI: Abd soft, non-tender, gravid appropriate for gestational age.   No rebound or guarding. MS: Extremities nontender, no edema, normal ROM Neurologic: Alert and oriented x 4.  GU: Neg CVAT.  PELVIC EXAM:  Dilation: 3.5 Effacement (%): 80 Cervical Position: Posterior Station: -1 Presentation: Vertex Exam by:: Santiago Bur, RN  FHT:  Baseline 140 , moderate variability, accelerations present, no decelerations Contractions: q 3-4 mins Irregular  Rare   Labs: Results for orders placed or performed during the hospital encounter of 04/11/22 (from the past 24 hour(s))  Protein / creatinine ratio, urine     Status: None   Collection Time: 04/11/22  1:46 AM  Result Value Ref Range   Creatinine, Urine 170.43  mg/dL   Total Protein, Urine 24 mg/dL   Protein Creatinine Ratio 0.14 0.00 - 0.15 mg/mg[Cre]  CBC     Status: None   Collection Time: 04/11/22  1:49 AM  Result Value Ref Range   WBC 8.3 4.0 - 10.5 K/uL   RBC 4.67 3.87 - 5.11 MIL/uL   Hemoglobin 13.5 12.0 - 15.0 g/dL   HCT 40.9 81.1 - 91.4 %   MCV 84.2 80.0 - 100.0 fL   MCH 28.9 26.0 - 34.0 pg   MCHC 34.4 30.0 - 36.0 g/dL   RDW 78.2 95.6 - 21.3 %   Platelets 259 150 - 400 K/uL   nRBC 0.0 0.0 - 0.2 %  Comprehensive metabolic panel     Status: Abnormal   Collection Time: 04/11/22  1:49 AM  Result Value Ref Range   Sodium 133 (L) 135 - 145 mmol/L   Potassium 3.4 (L) 3.5 - 5.1 mmol/L   Chloride 105 98 - 111 mmol/L   CO2 20 (L) 22 - 32 mmol/L   Glucose, Bld 81 70 - 99 mg/dL   BUN <5 (L) 6 - 20 mg/dL   Creatinine, Ser 0.86 0.44 - 1.00 mg/dL   Calcium 8.8 (L) 8.9 - 10.3 mg/dL   Total Protein 6.9 6.5 - 8.1 g/dL   Albumin 2.9 (L) 3.5 - 5.0 g/dL   AST 26 15 - 41 U/L   ALT 25 0 - 44 U/L   Alkaline Phosphatase 183 (H) 38 - 126 U/L   Total Bilirubin 0.5 0.3 - 1.2 mg/dL   GFR, Estimated >57 >84 mL/min   Anion gap 8 5 - 15    A/Positive/-- (10/26 1519)  Imaging:  No results found.  MAU Course/MDM: I have reviewed the triage vital signs and the nursing notes.   Pertinent labs & imaging results that were available during my care of the patient were reviewed by me and considered in my medical decision making (see chart for details).      I have reviewed her medical records including past results, notes and treatments.   I have ordered labs and reviewed results. Labs are normal  BPs remain elevated over the past 4 hours  Labor has diminished in frequency. There was no change over 2 hours.  NST reviewed, reassuring Consult Dr Vergie Living with presentation, exam findings and test results. He recommends induction  of labor.  Labor unit is full, will transfer when able Treatments in MAU included EFM.    Assessment: Single IUP at  [redacted]w[redacted]d Latent Labor Gestational Hypertension Postdates pregnancy  Plan: Admit to Labor  and Delivery Routine orders Cervix too advanced for Foley, will proceed with Pitocin Anticipate SVD.  Wynelle BourgeoisMarie Trystan Akhtar CNM, MSN Certified Nurse-Midwife 04/11/2022 1:57 AM Updated at [phone]

## 2022-04-11 NOTE — Lactation Note (Signed)
This note was copied from a baby's chart. Lactation Consultation Note  Patient Name: Boy Shantavia Jha TOIZT'I Date: 04/11/2022 Reason for consult: L&D Initial assessment;1st time breastfeeding;Exclusive pumping and bottle feeding;Term;Breastfeeding assistance;Other (Comment) (LC LD visit at 55 mins PP, As LC entered the Aunt was holding baby wrapped  in baby blankets. LC offered to assist to latch,baby rooting, mom receptive. Baby latched easily on the Lt. Br for 7 mins with swallows/ nipple well rounded when baby released.) Latch score 8 , per mom comfortable.  Age:83 mins     Maternal Data Does the patient have breastfeeding experience prior to this delivery?: No  Feeding Mother's Current Feeding Choice: Breast Milk and Formula  LATCH Score Latch: Grasps breast easily, tongue down, lips flanged, rhythmical sucking.  Audible Swallowing: A few with stimulation  Type of Nipple: Everted at rest and after stimulation  Comfort (Breast/Nipple): Soft / non-tender  Hold (Positioning): Assistance needed to correctly position infant at breast and maintain latch.  LATCH Score: 8   Lactation Tools Discussed/Used    Interventions Interventions: Breast feeding basics reviewed;Assisted with latch;Skin to skin;Breast massage;Hand express;Breast compression;Adjust position;Support pillows;Education  Discharge    Consult Status Consult Status: Follow-up from L&D Date: 04/11/22 Follow-up type: In-patient    Matilde Sprang Merland Holness 04/11/2022, 4:44 PM

## 2022-04-11 NOTE — Discharge Summary (Signed)
   Postpartum Discharge Summary  Date of Service updated   Patient Name: Tonya Wilkinson DOB: 12/08/2000 MRN: [id number]  Date of admission: 04/11/2022 Delivery date:04/11/2022  Delivering provider: ALBERT, CHRISTINA M  Date of discharge: 04/13/2022  Admitting diagnosis: Post term pregnancy over 40 weeks [O48.0] Intrauterine pregnancy: [redacted]w[redacted]d     Secondary diagnosis:  Principal Problem:   Vaginal delivery Active Problems:   Encounter for supervision of normal first pregnancy in first trimester   Abnormal finding on antenatal screen   Post term pregnancy over 40 weeks   Encounter for insertion of mirena IUD   Gestational hypertension  Additional problems:     Discharge diagnosis: Term Pregnancy Delivered                                              Post partum procedures: Post-placental Mirena IUD insertion  Augmentation: AROM and Pitocin Complications: None  Hospital course: Onset of Labor With Vaginal Delivery      21 y.o. yo G1P1001 at [redacted]w[redacted]d was admitted in Latent Labor on 04/11/2022, with new onset gHTN. Patient had an uncomplicated labor course and progressed to complete after Pitocin and AROM. She had an uncomplicated vaginal delivery.  Membrane Rupture Time/Date: 3:10 PM ,04/11/2022   Delivery Method:Vaginal, Spontaneous  Episiotomy: None  Lacerations:  1st degree;Vaginal  Patient had an uncomplicated postpartum course.  She is ambulating, tolerating a regular diet, passing flatus, and urinating well. Patient is discharged home in stable condition on 04/13/22.  Newborn Data: Birth date:04/11/2022  Birth time:3:37 PM  Gender:Female  Living status:Living  Apgars:9 ,9  Weight:4010 g   Magnesium Sulfate received: No BMZ received: No Rhophylac: N/A MMR: N/A T-DaP: Offered postpartum  Flu: No Transfusion: No  Physical exam  Vitals:   04/12/22 0610 04/12/22 1444 04/12/22 2145 04/13/22 0626  BP: 122/78 125/77 122/79 112/69  Pulse: 71 64 71 67  Resp: 16 16 17 16  Temp:  98.1 F (36.7 C) 98.2 F (36.8 C) 97.7 F (36.5 C) 98.6 F (37 C)  TempSrc: Oral Oral Oral Oral  SpO2: 98%  99% 100%  Weight:      Height:       General: alert, cooperative, and no distress Lochia: appropriate Uterine Fundus: firm Incision: N/A DVT Evaluation: No evidence of DVT seen on physical exam.  Labs: Lab Results  Component Value Date   WBC 8.3 04/11/2022   HGB 13.5 04/11/2022   HCT 39.3 04/11/2022   MCV 84.2 04/11/2022   PLT 259 04/11/2022      Latest Ref Rng & Units 04/11/2022    1:49 AM  CMP  Glucose 70 - 99 mg/dL 81    BUN 6 - 20 mg/dL <5    Creatinine 0.44 - 1.00 mg/dL 0.72    Sodium 135 - 145 mmol/L 133    Potassium 3.5 - 5.1 mmol/L 3.4    Chloride 98 - 111 mmol/L 105    CO2 22 - 32 mmol/L 20    Calcium 8.9 - 10.3 mg/dL 8.8    Total Protein 6.5 - 8.1 g/dL 6.9    Total Bilirubin 0.3 - 1.2 mg/dL 0.5    Alkaline Phos 38 - 126 U/L 183    AST 15 - 41 U/L 26    ALT 0 - 44 U/L 25     Edinburgh Score:    04/12/2022      6:09 AM  Edinburgh Postnatal Depression Scale Screening Tool  I have been able to laugh and see the funny side of things. 0  I have looked forward with enjoyment to things. 0  I have blamed myself unnecessarily when things went wrong. 1  I have been anxious or worried for no good reason. 1  I have felt scared or panicky for no good reason. 1  Things have been getting on top of me. 0  I have been so unhappy that I have had difficulty sleeping. 0  I have felt sad or miserable. 1  I have been so unhappy that I have been crying. 0  The thought of harming myself has occurred to me. 0  Edinburgh Postnatal Depression Scale Total 4     After visit meds:  Allergies as of 04/13/2022   No Known Allergies      Medication List     TAKE these medications    furosemide 20 MG tablet Commonly known as: LASIX Take 1 tablet (20 mg total) by mouth daily.   ibuprofen 600 MG tablet Commonly known as: ADVIL Take 1 tablet (600 mg total) by mouth  every 6 (six) hours.   multivitamin-prenatal 27-0.8 MG Tabs tablet Take 1 tablet by mouth daily at 12 noon.         Discharge home in stable condition Infant Feeding: Bottle and Breast Infant Disposition: home with mother Discharge instruction: per After Visit Summary and Postpartum booklet. Activity: Advance as tolerated. Pelvic rest for 6 weeks.  Diet: routine diet Future Appointments: Future Appointments  Date Time Provider [place]  04/19/2022  9:25 AM [facility] NURSE [facility] None  05/28/2022  9:55 AM Seabron Spates, CNM [facility] None   Follow up Visit: Message sent to [facility] by Dr. Gwenlyn Perking on 04/11/22.   Please schedule this patient for a In person postpartum visit in 6 weeks with the following provider: Any provider. Additional Postpartum F/U: BP check 1 week and IUD string check at postpartum visit   Low risk pregnancy complicated by: gHTN at time of admission  Delivery mode:  Vaginal, Spontaneous  Anticipated Birth Control:  PP IUD placed  04/13/2022 Tonya Wilkinson, CNM

## 2022-04-11 NOTE — Anesthesia Procedure Notes (Signed)
Epidural Patient location during procedure: OB Start time: 04/11/2022 8:41 AM End time: 04/11/2022 8:48 AM  Staffing Anesthesiologist: Lannie Fields, DO Performed: anesthesiologist   Preanesthetic Checklist Completed: patient identified, IV checked, risks and benefits discussed, monitors and equipment checked, pre-op evaluation and timeout performed  Epidural Patient position: sitting Prep: DuraPrep and site prepped and draped Patient monitoring: continuous pulse ox, blood pressure, heart rate and cardiac monitor Approach: midline Location: L3-L4 Injection technique: LOR air  Needle:  Needle type: Tuohy  Needle gauge: 17 G Needle length: 9 cm Needle insertion depth: 5 cm Catheter type: closed end flexible Catheter size: 19 Gauge Catheter at skin depth: 10 cm Test dose: negative  Assessment Sensory level: T8 Events: blood not aspirated, injection not painful, no injection resistance, no paresthesia and negative IV test  Additional Notes Patient identified. Risks/Benefits/Options discussed with patient including but not limited to bleeding, infection, nerve damage, paralysis, failed block, incomplete pain control, headache, blood pressure changes, nausea, vomiting, reactions to medication both or allergic, itching and postpartum back pain. Confirmed with bedside nurse the patient's most recent platelet count. Confirmed with patient that they are not currently taking any anticoagulation, have any bleeding history or any family history of bleeding disorders. Patient expressed understanding and wished to proceed. All questions were answered. Sterile technique was used throughout the entire procedure. Please see nursing notes for vital signs. Test dose was given through epidural catheter and negative prior to continuing to dose epidural or start infusion. Warning signs of high block given to the patient including shortness of breath, tingling/numbness in hands, complete motor  block, or any concerning symptoms with instructions to call for help. Patient was given instructions on fall risk and not to get out of bed. All questions and concerns addressed with instructions to call with any issues or inadequate analgesia.  Reason for block:procedure for pain

## 2022-04-11 NOTE — Progress Notes (Signed)
Labor Progress Note Tonya Wilkinson is a 21 y.o. G1P0 at [redacted]w[redacted]d presented for SOL and gHTN.  S: Doing well. Just vomited and bulging bag noted by RN. Family at bedside.   O:  BP (!) 137/91   Pulse (!) 128   Temp 97.9 F (36.6 C) (Axillary)   Resp 15   Ht 5\' 3"  (1.6 m)   Wt 64.7 kg   LMP 06/18/2021   SpO2 99%   BMI 25.26 kg/m   EFM: Baseline 135 bpm, moderate variability, + accels, no decels   CVE: Dilation: 10 Dilation Complete Date: 04/11/22 Dilation Complete Time: 1514 Effacement (%): 90 Cervical Position: Middle Station: Plus 2 Presentation: Vertex Exam by:: Dr. Gwenlyn Perking  A&P: 21 y.o. G1P0 101w4d   #Labor: Progressing well. Complete with bulging bag. AROM performed with clear fluid. Plus 2 station. Will set up table for delivery and start pushing.  #Pain: Epidural  #FWB: Cat 1  #GBS negative  #gHTN: Normal to mild range BP. No symptoms. Will continue to monitor.   Genia Del, MD 3:16 PM

## 2022-04-12 LAB — BIRTH TISSUE RECOVERY COLLECTION (PLACENTA DONATION)

## 2022-04-12 MED ORDER — FUROSEMIDE 20 MG PO TABS
20.0000 mg | ORAL_TABLET | Freq: Every day | ORAL | Status: DC
  Administered 2022-04-13: 20 mg via ORAL
  Filled 2022-04-12: qty 1

## 2022-04-12 NOTE — Lactation Note (Signed)
This note was copied from a baby's chart. Lactation Consultation Note  Patient Name: Tonya Wilkinson [id number] Date: 04/12/2022 Reason for consult: Follow-up assessment;Mother's request;1st time breastfeeding;Term Age:22 hours P1, term female infant, -4% weight loss. Per mom, infant is breastfeeding 20 to 40 minutes per feeding. LC explained that is normal Day 2 of life infant is currently cluster feeding. Mom had infant latched on her left breast using the cradle hold position infant was latched with depth and had been breastfeeding for 20 minutes when LC entered room. Per mom, she is only feeling tug with latch and no pain. LC discussed latching infant on both breast during a feeding and continue to breastfeed infant according to hunger cues and on demand. Mom knows to call RN/LC if she has any more questions , concerns or need latch assistance.  Maternal Data    Feeding Mother's Current Feeding Choice: Breast Milk and Formula  LATCH Score Latch: Grasps breast easily, tongue down, lips flanged, rhythmical sucking.  Audible Swallowing: Spontaneous and intermittent  Type of Nipple: Everted at rest and after stimulation  Comfort (Breast/Nipple): Soft / non-tender  Hold (Positioning): No assistance needed to correctly position infant at breast.  LATCH Score: 10   Lactation Tools Discussed/Used    Interventions    Discharge    Consult Status Consult Status: Follow-up Date: 04/13/22 Follow-up type: In-patient    Danelle Earthly 04/12/2022, 9:22 PM

## 2022-04-12 NOTE — Progress Notes (Signed)
Dr Mathis Fare is notified that pt is having difficulty with babysripts do to her e-mail.

## 2022-04-12 NOTE — Lactation Note (Signed)
This note was copied from a baby's chart. Lactation Consultation Note  Patient Name: Tonya Wilkinson [id number] Date: 04/12/2022 Reason for consult: Initial assessment;1st time breastfeeding;Primapara;Term Age:21 hours   P1 mother whose infant is now 11 hours old.  This is a term baby at 40+4 weeks.  Mother's current feeding preference is breast/formula.  RN requested latch assistance.  Taught hand expression; mother unable to express colostrum at this time.  Breast feeding basics reviewed.  Assisted to latch easily in the cross cradle position.  Observed "[name]" feeding for 9 minutes prior to leaving the room.  Encouraged STS and hand expression.  Showed father how to assist mother with breast feeding.  Suggested mother call for latch assistance as needed.      Maternal Data Has patient been taught Hand Expression?: Yes Does the patient have breastfeeding experience prior to this delivery?: No  Feeding Mother's Current Feeding Choice: Breast Milk and Formula  LATCH Score Latch: Repeated attempts needed to sustain latch, nipple held in mouth throughout feeding, stimulation needed to elicit sucking reflex.  Audible Swallowing: None  Type of Nipple: Everted at rest and after stimulation (Short shafted)  Comfort (Breast/Nipple): Soft / non-tender  Hold (Positioning): Assistance needed to correctly position infant at breast and maintain latch.  LATCH Score: 6   Lactation Tools Discussed/Used    Interventions Interventions: Breast feeding basics reviewed;Assisted with latch;Skin to skin;Breast massage;Hand express;Position options;Support pillows;Adjust position;Education;LC Services brochure  Discharge    Consult Status Consult Status: Follow-up Date: 04/13/22 Follow-up type: In-patient    Loraine Freid R Compton Brigance 04/12/2022, 4:23 AM

## 2022-04-12 NOTE — Progress Notes (Signed)
Post Partum Day 1  Subjective: Doing well. No acute events overnight. Pain is controlled and bleeding is appropriate. She is eating, drinking, voiding, and ambulating without issue. She is breast feeding which is going well. She has no other concerns at this time.  Objective: Blood pressure 122/78, pulse 71, temperature 98.1 F (36.7 C), temperature source Oral, resp. rate 16, height 5\' 3"  (1.6 m), weight 64.7 kg, last menstrual period 06/18/2021, SpO2 98 %, unknown if currently breastfeeding.  Physical Exam:  General: alert, cooperative, and no distress Lochia: appropriate Uterine Fundus: firm and below umbilicus  DVT Evaluation: no LE edema or calf tenderness to palpation   Recent Labs    04/11/22 0149  HGB 13.5  HCT 39.3    Assessment/Plan: Tonya Wilkinson is a 21 y.o. G1P1001 on PPD# 1 s/p SVD.  Progressing well. Meeting postpartum milestones. VSS. Continue routine postpartum care.  #gHTN: - BP within normal limits - No symptoms - Will start Lasix 20 mg daily to complete a 5 day course - Will continue to monitor while inpatient   Feeding: Breast  Contraception: Post-placental Mirena IUD placed Circumcision: Desires, consented at bedside  Dispo: Plan for discharge on PPD#2.    LOS: 1 day   26, MD  04/12/2022, 1:39 PM

## 2022-04-13 LAB — RPR: RPR Ser Ql: NONREACTIVE

## 2022-04-13 MED ORDER — IBUPROFEN 600 MG PO TABS: 600.0000 mg | ORAL_TABLET | Freq: Four times a day (QID) | ORAL | 0 refills | Status: AC

## 2022-04-13 MED ORDER — FUROSEMIDE 20 MG PO TABS: 20.0000 mg | ORAL_TABLET | Freq: Every day | ORAL | 0 refills | Status: AC

## 2022-04-13 NOTE — Progress Notes (Signed)
Attempted to assist patient with setting up Babyscripts app. Patient received e-mail; however, it did not provide a code, and when patient attempts to create an account, the app will not allow patient to create a log in account. Patient has blood pressure cuff that was provided to her by RN yesterday. Notified Maurine Cane, CNM. Drenda Freeze stated that patient is set up with a blood pressure check in one week. Instructed patient to check blood pressure daily and notify MD if BP is consistently higher than 135/85. Earl Gala, Linda Hedges [place]

## 2022-04-13 NOTE — Lactation Note (Signed)
This note was copied from a baby's chart. Lactation Consultation Note  Patient Name: Tonya Wilkinson [id number] Date: 04/13/2022 Reason for consult: Follow-up assessment;Term;Primapara Age:21 hours  "[name]" was observed to latch with ease, but infant's nose was into breast tissue. Mom was willing to lay on her side to nurse to provided more air space for breathing. Infant fed well with frequent swallows, (verified by cervical auscultation), sometimes aided by breast compressions. Specifics of an asymmetric latch were shown via [employer] animation to aid in obtaining a latch that would naturally provide more air space for baby to breathe.   Mom was taught how to do breast compressions and parents were taught the sound of swallows.   Mom has a Medela pump at home. Based on her nipple diameter, she will need size 21 flanges, which I provided.   Parents know how to reach [place] for post-discharge questions. I also informed Mom about [employer].    LATCH Score Latch: Grasps breast easily, tongue down, lips flanged, rhythmical sucking.  Audible Swallowing: Spontaneous and intermittent (sometimes breast compressions are needed)  Type of Nipple: Everted at rest and after stimulation  Comfort (Breast/Nipple): Soft / non-tender  Hold (Positioning): Assistance needed to correctly position infant at breast and maintain latch.  LATCH Score: 9   Lactation Tools Discussed/Used Tools: Flanges Flange Size: 21  Interventions Interventions: Assisted with latch;Education;Position options;Adjust position  Discharge Pump: Personal (Mom has a Medela pump at home)   Remigio Eisenmenger 04/13/2022, 12:09 PM

## 2022-04-14 ENCOUNTER — Inpatient Hospital Stay (HOSPITAL_COMMUNITY)
Admission: AD | Admit: 2022-04-14 | Payer: Medicaid Other | Source: Home / Self Care | Admitting: Obstetrics & Gynecology

## 2022-04-14 ENCOUNTER — Inpatient Hospital Stay (HOSPITAL_COMMUNITY): Payer: Medicaid Other

## 2022-04-15 NOTE — Anesthesia Postprocedure Evaluation (Signed)
Anesthesia Post Note  Patient: Tonya Wilkinson  Procedure(s) Performed: AN AD HOC LABOR EPIDURAL     Patient location during evaluation: Mother Baby Anesthesia Type: Epidural Level of consciousness: awake and alert Pain management: pain level controlled Vital Signs Assessment: post-procedure vital signs reviewed and stable Respiratory status: spontaneous breathing, nonlabored ventilation and respiratory function stable Cardiovascular status: stable Postop Assessment: no headache, no backache and epidural receding Anesthetic complications: no   No notable events documented.  Last Vitals: There were no vitals filed for this visit.  Last Pain: There were no vitals filed for this visit.               Pervis Hocking

## 2022-04-19 ENCOUNTER — Ambulatory Visit (INDEPENDENT_AMBULATORY_CARE_PROVIDER_SITE_OTHER): Payer: Medicaid Other

## 2022-04-19 NOTE — Progress Notes (Signed)
Patient presents for bp check.  Subjective:  Tonya Wilkinson is a 21 y.o. female here for BP check.  Patient is one week postpartum after 40.4 week delivery.   Hypertension ROS: taking medications as instructed, no medication side effects noted, no dyspnea on exertion, and no swelling of ankles.    Objective:  BP 116/84   Pulse 100   Wt 122 lb (55.3 kg)   LMP 06/18/2021   BMI 21.61 kg/m   Appearance alert, well appearing, and in no distress. General exam BP noted to be well controlled today in office.    Assessment:   Blood Pressure stable.   Plan:  Patient denies any headaches, dizziness or blurry vision. Patient is breast and bottle feeding.  Postpartum visit scheduled and reviewed any signs or symptoms to call us back for. Patient states understanding. Armandina Stammer RN

## 2022-05-28 ENCOUNTER — Other Ambulatory Visit (HOSPITAL_COMMUNITY)
Admission: RE | Admit: 2022-05-28 | Discharge: 2022-05-28 | Disposition: A | Discharge status: Home / Self Care | Payer: Medicaid Other | Source: Ambulatory Visit | Attending: Advanced Practice Midwife | Admitting: Advanced Practice Midwife

## 2022-05-28 ENCOUNTER — Encounter: Payer: Self-pay | Admitting: Advanced Practice Midwife

## 2022-05-28 ENCOUNTER — Ambulatory Visit (INDEPENDENT_AMBULATORY_CARE_PROVIDER_SITE_OTHER): Payer: Medicaid Other | Admitting: Advanced Practice Midwife

## 2022-05-28 DIAGNOSIS — Z124 Encounter for screening for malignant neoplasm of cervix: Secondary | ICD-10-CM | POA: Insufficient documentation

## 2022-05-28 NOTE — Progress Notes (Signed)
    Post Partum Visit Note  Tonya Wilkinson is a 21 y.o. G64P1001 female who presents for a postpartum visit. She is 6 weeks postpartum following a vaginal delivery.  I have fully reviewed the prenatal and intrapartum course. The delivery was at 40.4 gestational weeks.  Anesthesia: epidural. Postpartum course has been uneventful. Baby is doing well. Baby is feeding by breast. Bleeding staining only. Bowel function is normal. Bladder function is normal. Patient is not sexually active. Contraception method is IUD. Postpartum depression screening: negative.   The pregnancy intention screening data noted above was reviewed. Potential methods of contraception were discussed. The patient elected to proceed with No data recorded.    Health Maintenance Due  Topic Date Due   HPV VACCINES (1 - 2-dose series) Never done   TETANUS/TDAP  Never done   PAP-Cervical Cytology Screening  05/15/2022   PAP SMEAR-Modifier  05/15/2022    The following portions of the patient's history were reviewed and updated as appropriate: allergies, current medications, past family history, past medical history, past social history, past surgical history, and problem list.  Review of Systems Pertinent items noted in HPI and remainder of comprehensive ROS otherwise negative.  Objective:  LMP 06/18/2021    General:  alert and no distress   Breasts:  normal  Lungs: Resp unlabored  Heart:  Rate normal   Abdomen: soft, non-tender; bowel sounds normal; no masses,  no organomegaly   Wound N/a  GU exam:  normal      PAP DONE       Assessment:   Normal  postpartum exam.  Pap done  Plan:   Essential components of care per ACOG recommendations:  1.  Mood and well being: Patient with negative depression screening today. Reviewed local resources for support.  - Patient tobacco use? No.   - hx of drug use? No.    2. Infant care and feeding:  -Patient currently breastmilk feeding? Yes. Reviewed importance of draining  breast regularly to support lactation.  -Social determinants of health (SDOH) reviewed in EPIC. No concerns 3. Sexuality, contraception and birth spacing - Patient does not want a pregnancy in the next year.  - Reviewed reproductive life planning. Reviewed contraceptive methods based on pt preferences and effectiveness.  Patient desired IUD or IUS today.   - Discussed birth spacing of 18 months  4. Sleep and fatigue -Encouraged family/partner/community support of 4 hrs of uninterrupted sleep to help with mood and fatigue  5. Physical Recovery  - Discussed patients delivery and complications. She describes her labor as good. - Patient had a Vaginal, no problems at delivery. Patient had a 2nd degree laceration. Perineal healing reviewed. Patient expressed understanding - Patient has urinary incontinence? No. - Patient is safe to resume physical and sexual activity  6.  Health Maintenance - HM due items addressed No -   - Last pap smear No results found for: "DIAGPAP" Pap smear done at today's visit.  -Breast Cancer screening indicated? No.   7. Chronic Disease/Pregnancy Condition follow up: None  - PCP follow up  Aviva Signs, CNM   Armandina Stammer, RN [facility] [employer], [facility]

## 2022-05-30 LAB — CYTOLOGY - PAP: Diagnosis: NEGATIVE

## 2023-04-15 IMAGING — US US MFM OB DETAIL+14 WK
1 series · 13 of 28 positions shown · non-contrast
Comparison: none

[Series 1: us mfm ob detail+14 wk · 13 of 158 slices shown]
[im 6/158]
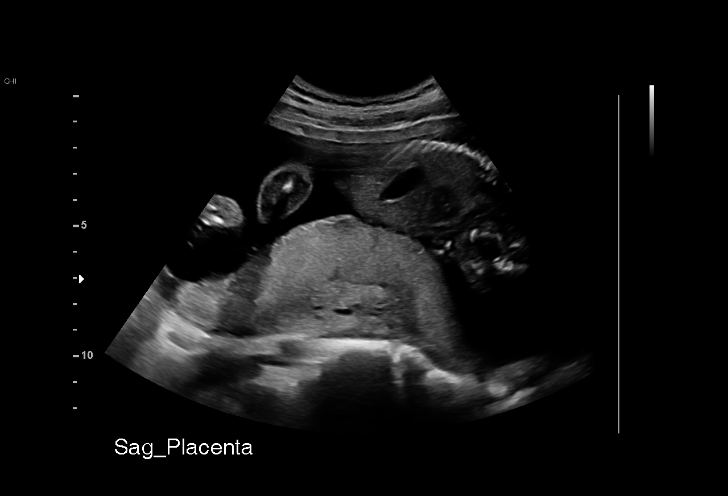
[im 18/158]
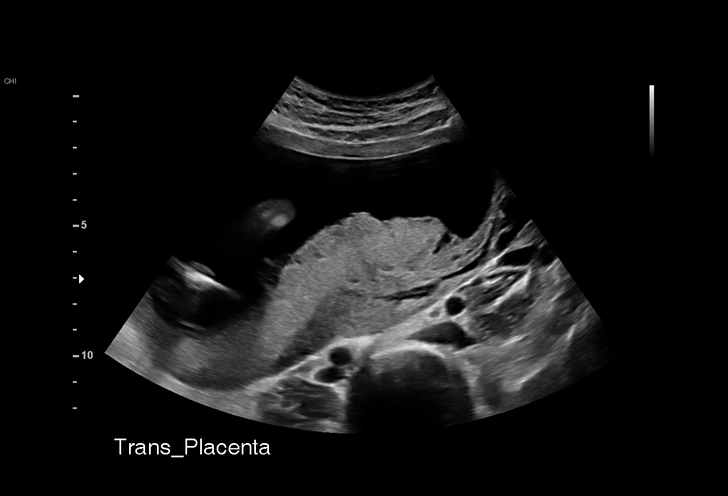
[im 30/158]
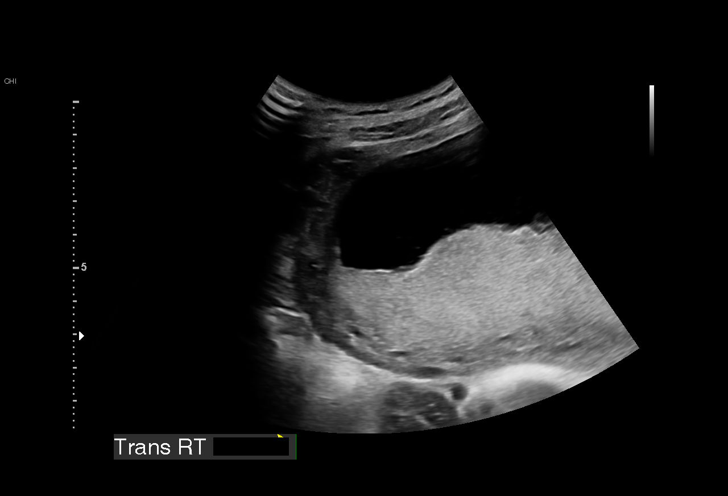
[im 41/158]
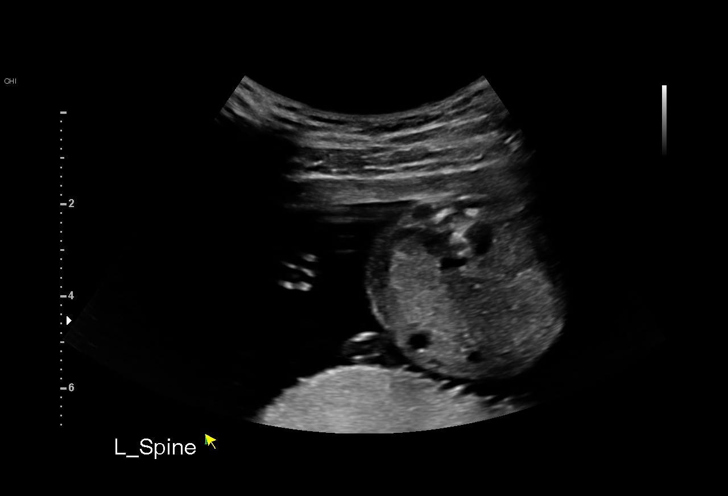
[im 53/158]
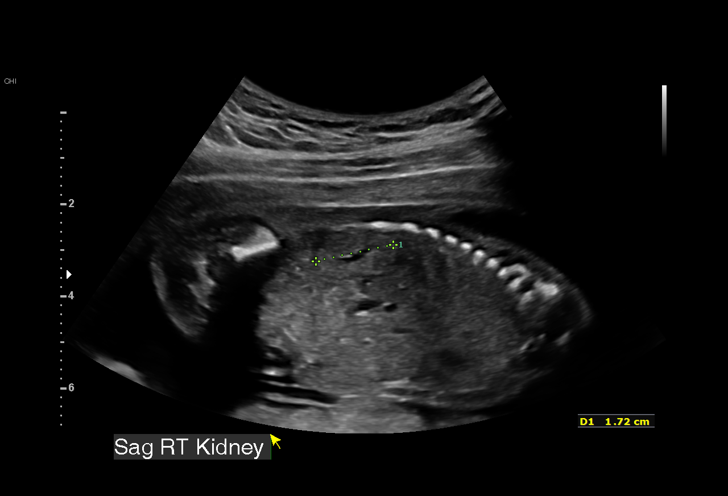
[im 64/158]
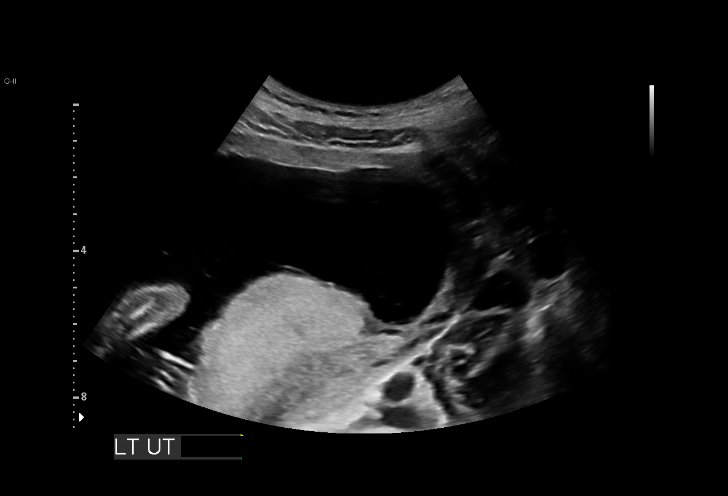
[im 82/158]
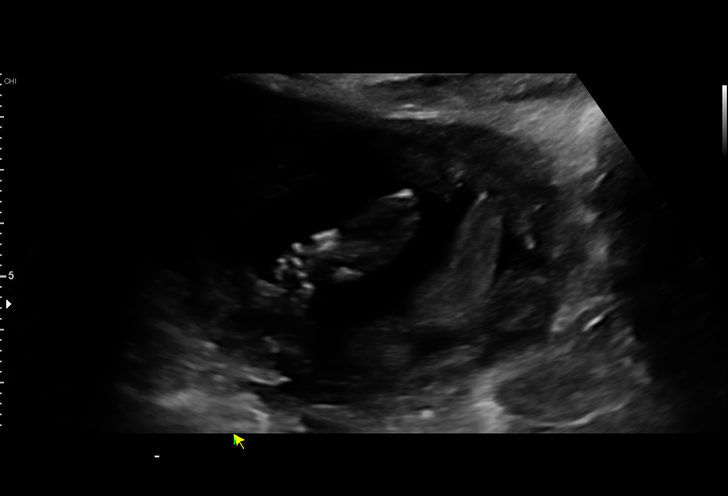
[im 94/158]
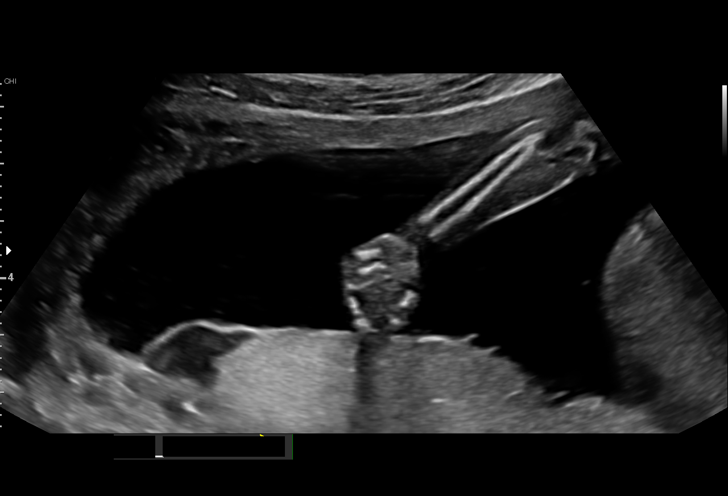
[im 105/158]
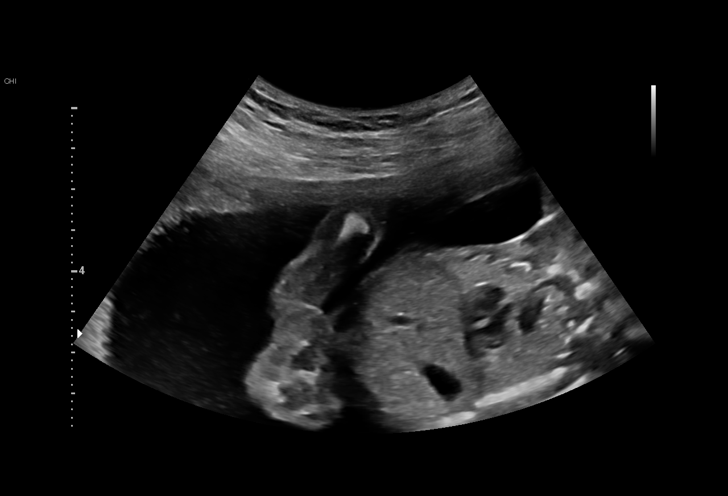
[im 117/158]
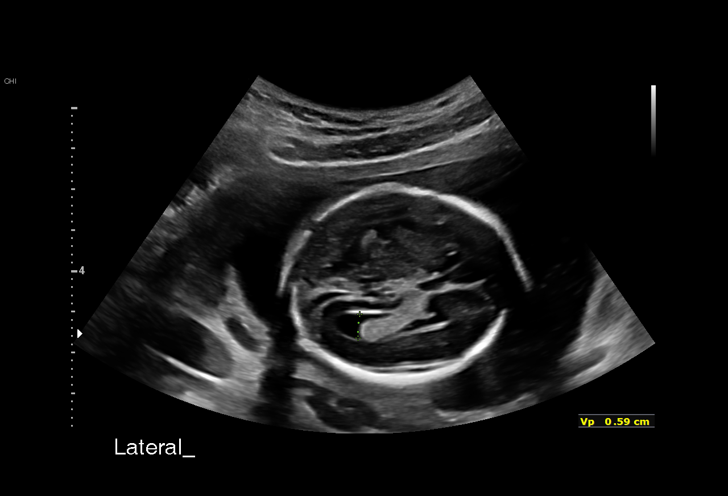
[im 128/158]
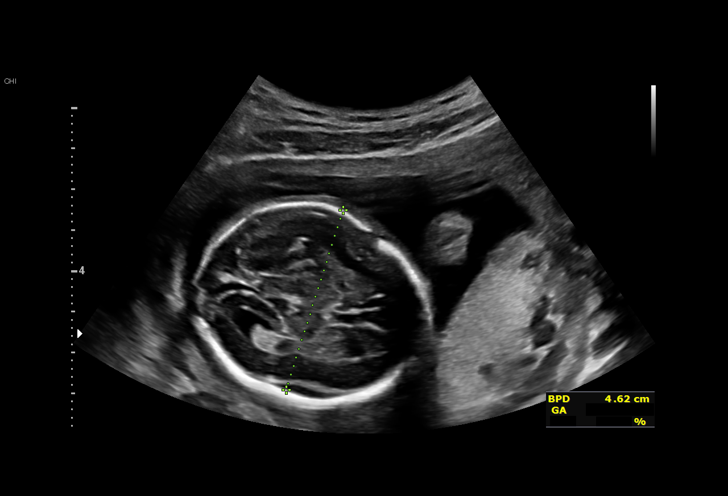
[im 140/158]
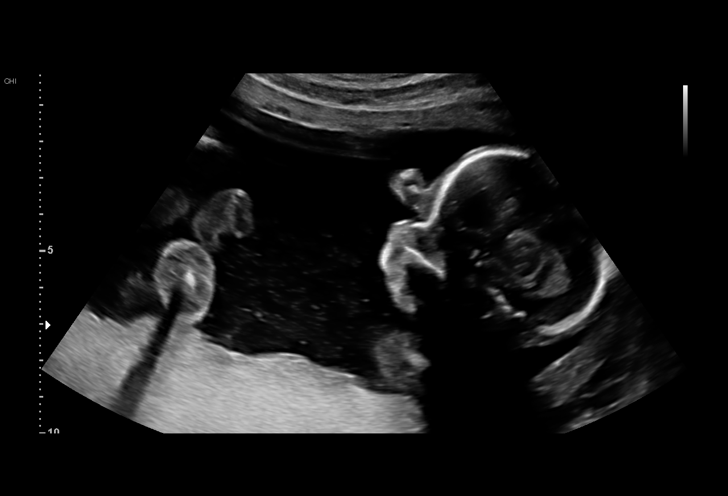
[im 152/158]
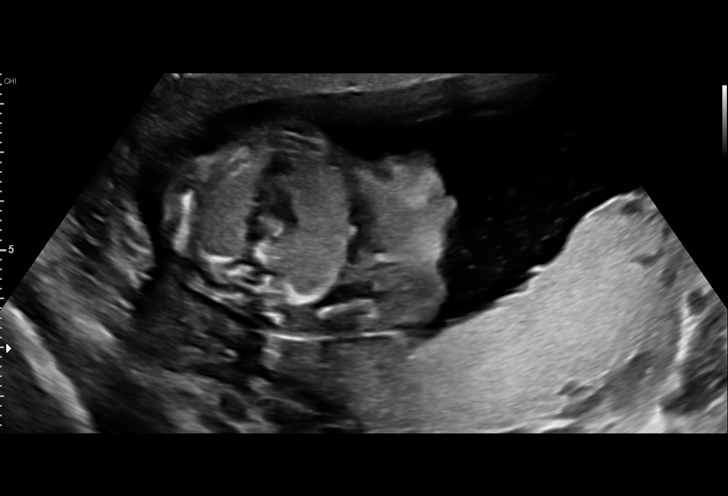

[13 of 28 positions shown; findings below may reference images not displayed]

Indications

 Abnormal biochemical screen
 Increased risk for Triploidy on NIPS
 Encounter for antenatal screening for
 malformations
 19 weeks gestation of pregnancy
Fetal Evaluation

 Num Of Fetuses:         1
 Fetal Heart Rate(bpm):  157
 Cardiac Activity:       Observed
 Presentation:           Cephalic
 Placenta:               Posterior
 P. Cord Insertion:      Visualized, central

 Amniotic Fluid
 AFI FV:      Within normal limits

                             Largest Pocket(cm)

Biometry

 BPD:      46.1  mm     G. Age:  20w 0d         82  %    CI:        75.45   %    70 - 86
                                                         FL/HC:      17.5   %    16.1 -
 HC:      168.3  mm     G. Age:  19w 3d         59  %    HC/AC:      1.16        1.09 -
 AC:       145   mm     G. Age:  19w 6d         68  %    FL/BPD:     64.0   %
 FL:       29.5  mm     G. Age:  19w 1d         41  %    FL/AC:      20.3   %    20 - 24
 HUM:      28.5  mm     G. Age:  19w 1d         52  %
 CER:      18.9  mm     G. Age:  18w 4d         16  %
 NFT:       3.1  mm
 LV:        5.9  mm
 CM:        4.1  mm

 Est. FW:     296  gm    0 lb 10 oz      67  %
OB History

 Gravidity:    1         Term:   0        Prem:   0        SAB:   0
 TOP:          0       Ectopic:  0        Living: 0
Gestational Age

 LMP:           21w 1d        Date:  06/18/21                 EDD:   03/25/22
 U/S Today:     19w 4d                                        EDD:   04/05/22
 Best:          19w 1d     Det. By:  U/S C R L  (09/12/21)    EDD:   04/08/22
Anatomy

 Cranium:               Appears normal         LVOT:                   Appears normal
 Cavum:                 Appears normal         Aortic Arch:            Appears normal
 Ventricles:            Appears normal         Ductal Arch:            Appears normal
 Choroid Plexus:        Appears normal         Diaphragm:              Appears normal
 Cerebellum:            Appears normal         Stomach:                Appears normal, left
                                                                       sided
 Posterior Fossa:       Appears normal         Abdomen:                Appears normal
 Nuchal Fold:           Appears normal         Abdominal Wall:         Appears nml (cord
                                                                       insert, abd wall)
 Face:                  Orbits appear          Cord Vessels:           Appears normal (3
                        normal                                         vessel cord)
 Lips:                  Appears normal         Kidneys:                Appear normal
 Palate:                Appears normal         Bladder:                Appears normal
 Thoracic:              Appears normal         Spine:                  Appears normal
 Heart:                 Appears normal         Upper Extremities:      Appears normal
                        (4CH, axis, and
                        situs)
 RVOT:                  Appears normal         Lower Extremities:      Appears normal

 Other:  Fetus appears to be a male. VC visualized. Lenses visualized.
         Heels/feet and open hands/5th digits visualized. Technically difficult
         due to fetal position.
Cervix Uterus Adnexa

 Cervix
 Length:           3.15  cm.
 Normal appearance by transabdominal scan.

 Uterus
 No abnormality visualized.

 Right Ovary
 Within normal limits.

 Left Ovary
 Within normal limits.

 Cul De Sac
 No free fluid seen.
 Adnexa
 No abnormality visualized.
Impression

 Follow up growth due to complete fetal anatomy and also
 increased triploidy with NIPT.
 Normal interval growth with measurements consistent with
 dates
 Good fetal movement and amniotic fluid volume
 Anatomy completed.
Recommendations

 Follow up growth as clinically indicated.
